# Patient Record
Sex: Male | Born: 1970 | Race: White | Hispanic: No | Marital: Single | Smoking: Never smoker
Health system: Southern US, Community
[De-identification: ages and names within clinical notes are randomized; demographics above are authoritative.]

## PROBLEM LIST (undated history)

## (undated) DIAGNOSIS — N400 Enlarged prostate without lower urinary tract symptoms: Secondary | ICD-10-CM

## (undated) DIAGNOSIS — R569 Unspecified convulsions: Secondary | ICD-10-CM

## (undated) DIAGNOSIS — F419 Anxiety disorder, unspecified: Secondary | ICD-10-CM

## (undated) DIAGNOSIS — F840 Autistic disorder: Secondary | ICD-10-CM

## (undated) DIAGNOSIS — F29 Unspecified psychosis not due to a substance or known physiological condition: Secondary | ICD-10-CM

## (undated) DIAGNOSIS — E785 Hyperlipidemia, unspecified: Secondary | ICD-10-CM

## (undated) HISTORY — DX: Hyperlipidemia, unspecified: E78.5

## (undated) HISTORY — DX: Benign prostatic hyperplasia without lower urinary tract symptoms: N40.0

## (undated) HISTORY — DX: Anxiety disorder, unspecified: F41.9

## (undated) HISTORY — DX: Unspecified psychosis not due to a substance or known physiological condition: F29

## (undated) HISTORY — DX: Unspecified convulsions: R56.9

## (undated) HISTORY — DX: Autistic disorder: F84.0

---

## 2005-01-03 ENCOUNTER — Other Ambulatory Visit: Payer: Self-pay

## 2005-01-03 ENCOUNTER — Emergency Department: Payer: Self-pay | Admitting: Emergency Medicine

## 2005-03-03 ENCOUNTER — Emergency Department: Payer: Self-pay | Admitting: Emergency Medicine

## 2008-05-24 ENCOUNTER — Emergency Department: Payer: Self-pay | Admitting: Emergency Medicine

## 2012-11-05 ENCOUNTER — Telehealth: Payer: Self-pay | Admitting: Nurse Practitioner

## 2012-11-07 NOTE — Telephone Encounter (Signed)
I attempted to return Michelle's call at [phone]. I left a message for Marcelino Duster to call [facility] about Molly Maduro. I have scheduled him for an appointment. Please see schedule.

## 2012-11-07 NOTE — Telephone Encounter (Signed)
He does not have appt scheduled. See first available

## 2012-11-07 NOTE — Telephone Encounter (Signed)
I called and LMVM for Marcelino Duster that I was returning call.

## 2012-11-11 ENCOUNTER — Encounter: Payer: Self-pay | Admitting: *Deleted

## 2012-11-14 ENCOUNTER — Encounter: Payer: Self-pay | Admitting: Neurology

## 2012-11-14 ENCOUNTER — Other Ambulatory Visit: Payer: Self-pay

## 2012-11-14 ENCOUNTER — Ambulatory Visit (INDEPENDENT_AMBULATORY_CARE_PROVIDER_SITE_OTHER): Payer: Medicare Other | Admitting: Neurology

## 2012-11-14 VITALS — BP 99/62 | HR 79 | Ht 67.0 in | Wt 137.0 lb

## 2012-11-14 DIAGNOSIS — G40309 Generalized idiopathic epilepsy and epileptic syndromes, not intractable, without status epilepticus: Secondary | ICD-10-CM | POA: Insufficient documentation

## 2012-11-14 DIAGNOSIS — Z5181 Encounter for therapeutic drug level monitoring: Secondary | ICD-10-CM

## 2012-11-14 MED ORDER — VITAMIN D 1000 UNITS PO TABS: 1000.0000 [IU] | ORAL_TABLET | Freq: Every day | ORAL | Status: DC

## 2012-11-14 NOTE — Progress Notes (Signed)
   Reason for visit: Seizures  [name] is an 42 y.o. male  History of present illness:  [name] is a 42 year old right-handed white male with a history of autism and seizures. The patient had a seizure sometime within the last month. According to the caretaker, the patient missed several doses of Dilantin around the time of the seizures. When he was seen in October 2013, the patient was on 200 mg of Dilantin twice daily. The blood level revealed a Dilantin level of 23, but the patient was tolerating this dose well, and the Dilantin dosing was not changed. At some point, the Dilantin dosing was reduced to 300 mg daily. The patient returns for an evaluation.  Past Medical History  Diagnosis Date  . Seizures   . Autism   . Psychosis   . Dyslipidemia   . Anxiety disorder     History reviewed. No pertinent past surgical history.  History reviewed. No pertinent family history.  Social history:  reports that he has never smoked. He has never used smokeless tobacco. He reports that he does not drink alcohol or use illicit drugs.   No Known Allergies  Medications:  Current Outpatient Prescriptions on File Prior to Visit  Medication Sig Dispense Refill  . busPIRone (BUSPAR) 15 MG tablet Take 15 mg by mouth 2 (two) times daily.      . fenofibrate (TRICOR) 145 MG tablet Take 145 mg by mouth daily.      Marland Kitchen LORazepam (ATIVAN) 1 MG tablet Take 1 mg by mouth daily.      . phenytoin (DILANTIN) 100 MG ER capsule Take 300 mg by mouth at bedtime. Using 100mg  caps  ( 2 capsules po bid)       No current facility-administered medications on file prior to visit.    ROS:  Out of a complete 14 system review of symptoms, the patient complains only of the following symptoms, and all other reviewed systems are negative.  History of seizures  Blood pressure 99/62, pulse 79, height 5\' 7"  (1.702 m), weight 137 lb (62.143 kg).  Physical Exam  General: The patient is alert and cooperative at  the time of the examination.  Skin: No significant peripheral edema is noted.   Neurologic Exam  Cranial nerves: Facial symmetry is present. Speech is normal, no aphasia or dysarthria is noted. Extraocular movements are full. Visual fields are full.  Motor: The patient has good strength in all 4 extremities.  Coordination: The patient has good finger-nose-finger and heel-to-shin bilaterally, but he is apraxic with the use of the lower extremities..  Gait and station: The patient has a normal gait. Tandem gait is apraxic. Romberg is negative. No drift is seen.  Reflexes: Deep tendon reflexes are symmetric.   Assessment/Plan:  One. History seizures  2. Autism  The patient is on 300 mg of Dilantin, brand-name, at this time. We will check a Dilantin level at this point. The patient will followup in 6 months. The patient will go on vitamin D, 1000 units daily.  Clarence Palau MD 11/15/2012 3:34 PM  [facility] [address] [place], [place] [address]  Phone [phone] Fax [phone]

## 2012-11-14 NOTE — Telephone Encounter (Signed)
The patient requested we sent Rx for Vitamin D to the pharmacy.

## 2012-11-17 ENCOUNTER — Telehealth: Payer: Self-pay | Admitting: Neurology

## 2012-11-17 MED ORDER — PHENYTOIN 50 MG PO CHEW: 50.0000 mg | CHEWABLE_TABLET | Freq: Every day | ORAL | Status: DC

## 2012-11-17 NOTE — Telephone Encounter (Signed)
I called the patient and I talked with a caretaker. The Dilantin level was slightly low at 9.6. I'll increase the dosing of the Dilantin taking 350 mg total daily. I will call in a 50 mg tablet.

## 2012-11-18 ENCOUNTER — Ambulatory Visit: Payer: Self-pay | Admitting: Nurse Practitioner

## 2013-01-06 ENCOUNTER — Ambulatory Visit: Payer: Self-pay | Admitting: Nurse Practitioner

## 2013-03-13 ENCOUNTER — Emergency Department: Payer: Self-pay | Admitting: Emergency Medicine

## 2013-03-13 LAB — COMPREHENSIVE METABOLIC PANEL
ALBUMIN: 4 g/dL (ref 3.4–5.0)
ALK PHOS: 68 U/L
ALT: 27 U/L (ref 12–78)
AST: 35 U/L (ref 15–37)
Anion Gap: 1 — ABNORMAL LOW (ref 7–16)
BUN: 14 mg/dL (ref 7–18)
Bilirubin,Total: 0.2 mg/dL (ref 0.2–1.0)
CALCIUM: 9.1 mg/dL (ref 8.5–10.1)
CREATININE: 1.15 mg/dL (ref 0.60–1.30)
Chloride: 105 mmol/L (ref 98–107)
Co2: 31 mmol/L (ref 21–32)
EGFR (Non-African Amer.): >60
Glucose: 105 mg/dL — ABNORMAL HIGH (ref 65–99)
Osmolality: 275 (ref 275–301)
Potassium: 3.7 mmol/L (ref 3.5–5.1)
Sodium: 137 mmol/L (ref 136–145)
Total Protein: 7.3 g/dL (ref 6.4–8.2)

## 2013-03-13 LAB — CBC
HCT: 36.6 % — AB (ref 40.0–52.0)
HGB: 12.7 g/dL — ABNORMAL LOW (ref 13.0–18.0)
MCH: 34 pg (ref 26.0–34.0)
MCHC: 34.6 g/dL (ref 32.0–36.0)
MCV: 98 fL (ref 80–100)
Platelet: 239 10*3/uL (ref 150–440)
RBC: 3.73 10*6/uL — AB (ref 4.40–5.90)
RDW: 12 % (ref 11.5–14.5)
WBC: 5.3 10*3/uL (ref 3.8–10.6)

## 2013-03-13 LAB — SALICYLATE LEVEL

## 2013-03-13 LAB — ACETAMINOPHEN LEVEL

## 2013-03-13 LAB — ETHANOL
Ethanol %: <0.003 % (ref 0.000–0.080)
Ethanol: <3 mg/dL

## 2013-03-15 LAB — URINALYSIS, COMPLETE
Bacteria: NONE SEEN
Bilirubin,UR: NEGATIVE
Blood: NEGATIVE
GLUCOSE, UR: NEGATIVE mg/dL (ref 0–75)
Ketone: NEGATIVE
LEUKOCYTE ESTERASE: NEGATIVE
NITRITE: NEGATIVE
PROTEIN: NEGATIVE
Ph: 5 (ref 4.5–8.0)
RBC,UR: 1 /HPF (ref 0–5)
Specific Gravity: 1.018 (ref 1.003–1.030)
Squamous Epithelial: NONE SEEN
WBC UR: 1 /HPF (ref 0–5)

## 2013-03-15 LAB — DRUG SCREEN, URINE

## 2013-05-13 ENCOUNTER — Telehealth: Payer: Self-pay | Admitting: Nurse Practitioner

## 2013-05-14 ENCOUNTER — Encounter: Payer: Self-pay | Admitting: Nurse Practitioner

## 2013-05-14 ENCOUNTER — Encounter (INDEPENDENT_AMBULATORY_CARE_PROVIDER_SITE_OTHER): Payer: Self-pay

## 2013-05-14 ENCOUNTER — Other Ambulatory Visit: Payer: Self-pay | Admitting: Nurse Practitioner

## 2013-05-14 ENCOUNTER — Ambulatory Visit (INDEPENDENT_AMBULATORY_CARE_PROVIDER_SITE_OTHER): Payer: Medicare Other | Admitting: Nurse Practitioner

## 2013-05-14 VITALS — BP 132/79 | HR 93 | Ht 66.5 in | Wt 148.0 lb

## 2013-05-14 DIAGNOSIS — G40309 Generalized idiopathic epilepsy and epileptic syndromes, not intractable, without status epilepticus: Secondary | ICD-10-CM

## 2013-05-14 DIAGNOSIS — Z79899 Other long term (current) drug therapy: Secondary | ICD-10-CM

## 2013-05-14 NOTE — Patient Instructions (Signed)
Per group home sheet 

## 2013-05-14 NOTE — Progress Notes (Signed)
[facility]  PATIENT: [name] DOB: 10/04/1970   REASON FOR VISIT: Followup for seizure disorder   HISTORY OF PRESENT ILLNESS:[name], 43 -year-old male returns for followup. He was last seen in this office on 11/14/2012 by Dr. Jannifer Franklin. He had an adjustment to his Dilantin at that time from 300 mg to 350 mg. He currently takes his Dilantin in the mornings according to the caregiver from his group home. He has not had any further seizure activity since September 2014. He had admission to behavioral health in January for behavior management. He returns for reevaluation  HISTORY: of autism and seizures. The patient had a seizure sometime within the last month. According to the caretaker, the patient missed several doses of Dilantin around the time of the seizures. When he was seen in October 2013, the patient was on 200 mg of Dilantin twice daily. The blood level revealed a Dilantin level of 23, but the patient was tolerating this dose well, and the Dilantin dosing was not changed. At some point, the Dilantin dosing was reduced to 300 mg daily. The patient returns for an evaluation.   REVIEW OF SYSTEMS: Full 14 system review of systems performed and notable only for those listed, all others are neg:  Constitutional: N/A  Cardiovascular: N/A  Ear/Nose/Throat: N/A  Skin: N/A  Eyes: N/A  Respiratory: N/A  Gastroitestinal: N/A  Hematology/Lymphatic: N/A  Endocrine: N/A Musculoskeletal:N/A  Allergy/Immunology: N/A  Neurological: N/A Psychiatric: agitation, depression ,anxiety, hyperactivity  ALLERGIES: No Known Allergies  HOME MEDICATIONS: Outpatient Prescriptions Prior to Visit  Medication Sig Dispense Refill  . cholecalciferol (VITAMIN D) 1000 UNITS tablet Take 1 tablet (1,000 Units total) by mouth daily.  90 tablet  1  . cloNIDine (CATAPRES - DOSED IN MG/24 HR) 0.2 mg/24hr patch Place 1 patch onto the skin once a week.      Marland Kitchen dextromethorphan (DELSYM)  30 MG/5ML liquid Take 30 mg by mouth as needed for cough.      . fenofibrate (TRICOR) 145 MG tablet Take 145 mg by mouth daily.      . hydrocortisone (ANUSOL-HC) 2.5 % rectal cream Place 2.5 application rectally as needed.       . hydrocortisone 2.5 % cream Apply 2.5 application topically as needed.       . Hydrocortisone Butyrate 0.1 % SOLN 0.1 Bottles as needed (monday- friday night).       Marland Kitchen ketoconazole (NIZORAL) 2 % shampoo Apply 2 application topically daily.      Marland Kitchen LORazepam (ATIVAN) 1 MG tablet Take 1 mg by mouth daily.      . phenytoin (DILANTIN) 100 MG ER capsule Take 300 mg by mouth at bedtime. Using 100mg  caps  ( 2 capsules po bid)      . phenytoin (DILANTIN) 50 MG tablet Chew 1 tablet (50 mg total) by mouth daily.  90 tablet  3  . polyethylene glycol powder (GLYCOLAX/MIRALAX) powder Take 17 g by mouth as needed (Tuesday and Thursday).       . busPIRone (BUSPAR) 15 MG tablet Take 15 mg by mouth 2 (two) times daily.       No facility-administered medications prior to visit.    PAST MEDICAL HISTORY: Past Medical History  Diagnosis Date  . Seizures   . Autism   . Psychosis   . Dyslipidemia   . Anxiety disorder     PAST SURGICAL HISTORY: History reviewed. No pertinent past surgical history.  FAMILY HISTORY: History reviewed. No pertinent family history.  SOCIAL HISTORY: History   Social History  . Marital Status: Unknown    Spouse Name: N/A    Number of Children: 0  . Years of Education: N/A   Occupational History  . Not on file.   Social History Main Topics  . Smoking status: Never Smoker   . Smokeless tobacco: Never Used  . Alcohol Use: No  . Drug Use: No  . Sexual Activity: Not on file   Other Topics Concern  . Not on file   Social History Narrative  . No narrative on file     PHYSICAL EXAM  Filed Vitals:   05/14/13 1054  BP: 132/79  Pulse: 93  Height: 5' 6.5" (1.689 m)  Weight: 148 lb (67.132 kg)   Body mass index is 23.53  kg/(m^2).  Generalized: Well developed, in no acute distress    Neurological examination   Mentation: Alert oriented to time, place, history taking. Follows all commands speech and language fluent  Cranial nerve II-XII: .Pupils were equal round reactive to light extraocular movements were full, visual field were full on confrontational test. Facial sensation and strength were normal. hearing was intact to finger rubbing bilaterally. Uvula tongue midline. head turning and shoulder shrug were normal and symmetric.Tongue protrusion into cheek strength was normal. Motor: normal bulk and tone, full strength in the BUE, BLE, . No focal weakness Coordination: finger-nose-finger, heel-to-shin bilaterally, apraxia with the use of lower extremities  Reflexes: Brachioradialis 2/2, biceps 2/2, triceps 2/2, patellar 2/2, Achilles 2/2, plantar responses were flexor bilaterally. Gait and Station: Rising up from seated position without assistance, normal stance,  moderate stride, good arm swing, smooth turning Tandem gait is apraxic   DIAGNOSTIC DATA (LABS, IMAGING, TESTING) -   ASSESSMENT AND PLAN  43 y.o. year old male  has a past medical history of Seizures; Autism; Psychosis; Dyslipidemia; and Anxiety disorder. here to followup for seizure disorder.  Will obtain trough level of Dilantin in the a.m., patient already has taken his meds Followup in 6 months [name], [facility], [facility], [facility] [address], [place], [place] [address] [phone]

## 2013-05-14 NOTE — Progress Notes (Signed)
I have read the note, and I agree with the clinical assessment and plan.  [name],[name]   

## 2013-06-01 ENCOUNTER — Other Ambulatory Visit: Payer: Self-pay | Admitting: Nurse Practitioner

## 2013-08-07 ENCOUNTER — Encounter: Payer: Self-pay | Admitting: Neurology

## 2013-08-07 ENCOUNTER — Telehealth: Payer: Self-pay | Admitting: Nurse Practitioner

## 2013-08-07 DIAGNOSIS — J309 Allergic rhinitis, unspecified: Secondary | ICD-10-CM | POA: Insufficient documentation

## 2013-08-07 DIAGNOSIS — F32A Depression, unspecified: Secondary | ICD-10-CM | POA: Insufficient documentation

## 2013-08-07 DIAGNOSIS — F329 Major depressive disorder, single episode, unspecified: Secondary | ICD-10-CM | POA: Insufficient documentation

## 2013-08-07 DIAGNOSIS — G43909 Migraine, unspecified, not intractable, without status migrainosus: Secondary | ICD-10-CM | POA: Insufficient documentation

## 2013-08-07 DIAGNOSIS — E781 Pure hyperglyceridemia: Secondary | ICD-10-CM | POA: Insufficient documentation

## 2013-08-07 NOTE — Telephone Encounter (Signed)
Called patient regarding rescheduling 11/18/13 appointment per Carolyn's schedule, left message with his worker regarding new appointment time. Printed and sent letter.

## 2013-09-10 LAB — PHENYTOIN LEVEL, FREE
PHENYTOIN: 8.9 ug/mL — AB (ref 10.0–20.0)
Phenytoin, Free: 0.6 ug/mL — ABNORMAL LOW (ref 1.0–2.0)

## 2013-09-16 ENCOUNTER — Encounter: Payer: Self-pay | Admitting: Neurology

## 2013-09-21 NOTE — Progress Notes (Signed)
Unable to reach patient by phone, letter has been sent with lab results.

## 2013-11-18 ENCOUNTER — Ambulatory Visit: Payer: Medicare Other | Admitting: Nurse Practitioner

## 2013-11-24 ENCOUNTER — Encounter (INDEPENDENT_AMBULATORY_CARE_PROVIDER_SITE_OTHER): Payer: Self-pay

## 2013-11-24 ENCOUNTER — Ambulatory Visit (INDEPENDENT_AMBULATORY_CARE_PROVIDER_SITE_OTHER): Payer: Medicare Other | Admitting: Nurse Practitioner

## 2013-11-24 ENCOUNTER — Other Ambulatory Visit: Payer: Self-pay | Admitting: *Deleted

## 2013-11-24 ENCOUNTER — Encounter: Payer: Self-pay | Admitting: Nurse Practitioner

## 2013-11-24 VITALS — BP 116/72 | HR 89 | Ht 66.5 in | Wt 160.4 lb

## 2013-11-24 DIAGNOSIS — Z5181 Encounter for therapeutic drug level monitoring: Secondary | ICD-10-CM

## 2013-11-24 DIAGNOSIS — G40309 Generalized idiopathic epilepsy and epileptic syndromes, not intractable, without status epilepticus: Secondary | ICD-10-CM

## 2013-11-24 MED ORDER — DIAZEPAM 10 MG PO TABS: 10.0000 mg | ORAL_TABLET | Freq: Once | ORAL | Status: DC

## 2013-11-24 MED ORDER — PHENYTOIN SODIUM EXTENDED 100 MG PO CAPS: 300.0000 mg | ORAL_CAPSULE | Freq: Every day | ORAL | Status: DC

## 2013-11-24 MED ORDER — PHENYTOIN 50 MG PO CHEW: 50.0000 mg | CHEWABLE_TABLET | Freq: Every day | ORAL | Status: DC

## 2013-11-24 NOTE — Progress Notes (Signed)
[facility]  PATIENT: [name] DOB: 27-Oct-1970   REASON FOR VISIT: Followup for seizure disorder   HISTORY OF PRESENT ILLNESS:[name], 43 -year-old male returns for followup. He was last seen in this office on 05/14/13.Dilantin dose is 350 mg. He currently takes his Dilantin in the mornings according to the caregiver from his group home. He has not had any further seizure activity since September 2014. He had admission to behavioral health in January 2015  for behavior management. Appetite is good, sleeping well. Lives in a group home. He returns for reevaluation.   HISTORY: of autism and seizures. The patient had a seizure sometime within the last month. According to the caretaker, the patient missed several doses of Dilantin around the time of the seizures. When he was seen in October 2013, the patient was on 200 mg of Dilantin twice daily. The blood level revealed a Dilantin level of 23, but the patient was tolerating this dose well, and the Dilantin dosing was not changed. At some point, the Dilantin dosing was reduced to 300 mg daily. The patient returns for an evaluation.    REVIEW OF SYSTEMS: Full 14 system review of systems performed and notable only for those listed, all others are neg:  Constitutional: N/A  Cardiovascular: N/A  Ear/Nose/Throat: N/A  Skin: N/A  Eyes: N/A  Respiratory: N/A  Gastroitestinal: N/A  Hematology/Lymphatic: N/A  Endocrine: N/A Musculoskeletal:N/A  Allergy/Immunology: N/A  Neurological: N/A Psychiatric: N/A Sleep : NA   ALLERGIES: No Known Allergies  HOME MEDICATIONS: Outpatient Prescriptions Prior to Visit  Medication Sig Dispense Refill  . chlorproMAZINE (THORAZINE) 50 MG tablet       . cholecalciferol (VITAMIN D) 1000 UNITS tablet Take 1 tablet (1,000 Units total) by mouth daily.  90 tablet  1  . cloNIDine (CATAPRES - DOSED IN MG/24 HR) 0.2 mg/24hr patch Place 1 patch onto the skin once a week.      Marland Kitchen  dextromethorphan (DELSYM) 30 MG/5ML liquid Take 30 mg by mouth as needed for cough.      . diazepam (VALIUM) 10 MG tablet       . fenofibrate (TRICOR) 145 MG tablet Take 145 mg by mouth daily.      . hydrocortisone (ANUSOL-HC) 2.5 % rectal cream Place 2.5 application rectally as needed.       . hydrocortisone 2.5 % cream Apply 2.5 application topically as needed.       . Hydrocortisone Butyrate 0.1 % SOLN 0.1 Bottles as needed (monday- friday night).       Marland Kitchen ketoconazole (NIZORAL) 2 % shampoo Apply 2 application topically daily.      Marland Kitchen LORazepam (ATIVAN) 1 MG tablet Take 1 mg by mouth daily.      . phenytoin (DILANTIN) 100 MG ER capsule Take 300 mg by mouth at bedtime. Using 100mg  caps  ( 2 capsules po bid)      . phenytoin (DILANTIN) 50 MG tablet Chew 1 tablet (50 mg total) by mouth daily.  90 tablet  3  . polyethylene glycol powder (GLYCOLAX/MIRALAX) powder Take 17 g by mouth as needed (Tuesday and Thursday).       . risperiDONE (RISPERDAL) 0.5 MG tablet       . SUMAtriptan Succinate (IMITREX PO) Take 25 mg by mouth as needed.       No facility-administered medications prior to visit.    PAST MEDICAL HISTORY: Past Medical History  Diagnosis Date  . Seizures   . Autism   .  Psychosis   . Dyslipidemia   . Anxiety disorder     PAST SURGICAL HISTORY: History reviewed. No pertinent past surgical history.  FAMILY HISTORY: History reviewed. No pertinent family history.  SOCIAL HISTORY: History   Social History  . Marital Status: Unknown    Spouse Name: N/A    Number of Children: 0  . Years of Education: N/A   Occupational History  . Not on file.   Social History Main Topics  . Smoking status: Never Smoker   . Smokeless tobacco: Never Used  . Alcohol Use: No  . Drug Use: No  . Sexual Activity: Not on file   Other Topics Concern  . Not on file   Social History Narrative  . No narrative on file     PHYSICAL EXAM  Filed Vitals:   11/24/13 1522  BP: 116/72    Pulse: 89  Height: 5' 6.5" (1.689 m)  Weight: 160 lb 6.4 oz (72.757 kg)   Body mass index is 25.5 kg/(m^2). Generalized: Well developed, in no acute distress  Neurological examination  Mentation: Alert oriented to time, place, history taking. Follows most  commands speech and language fluent  Cranial nerve II-XII: .Pupils were equal round reactive to light extraocular movements were full, visual field were full on confrontational test. Facial sensation and strength were normal. hearing was intact to finger rubbing bilaterally. Uvula tongue midline. head turning and shoulder shrug were normal and symmetric.Tongue protrusion into cheek strength was normal.  Motor: normal bulk and tone, full strength in the BUE, BLE, . No focal weakness  Coordination: finger-nose-finger, heel-to-shin bilaterally, apraxia with the use of lower extremities  Reflexes: Brachioradialis 2/2, biceps 2/2, triceps 2/2, patellar 2/2, Achilles 2/2, plantar responses were flexor bilaterally.  Gait and Station: Rising up from seated position without assistance, normal stance, moderate stride, good arm swing, smooth turning Tandem gait is apraxic   ASSESSMENT AND PLAN  43 y.o. year old male  has a past medical history of Seizures; Autism; Psychosis; Dyslipidemia; and Anxiety disorder. here to follow up for seizure disorder. Last seizure September 2014  Check CBC and CMP,  Patient needs Valium prior to having the labs. Call for any seizure activity Will renew meds Follow up yearly and  when necessary Dennie Bible, [facility], [facility], APRN  [name] [address], [place], [place] [address] [phone]

## 2013-11-24 NOTE — Patient Instructions (Signed)
Per group home sheet 

## 2013-11-25 ENCOUNTER — Other Ambulatory Visit: Payer: Self-pay

## 2013-11-25 MED ORDER — PHENYTOIN SODIUM EXTENDED 100 MG PO CAPS: 300.0000 mg | ORAL_CAPSULE | Freq: Every day | ORAL | Status: DC

## 2013-11-25 MED ORDER — PHENYTOIN 50 MG PO CHEW: 50.0000 mg | CHEWABLE_TABLET | Freq: Every day | ORAL | Status: DC

## 2013-11-25 NOTE — Telephone Encounter (Signed)
Pharmacy requests written Rx, will not accept E-Rx.

## 2013-11-25 NOTE — Progress Notes (Signed)
I have read the note, and I agree with the clinical assessment and plan.  Vignesh Willert KEITH   

## 2013-12-22 ENCOUNTER — Telehealth: Payer: Self-pay | Admitting: Nurse Practitioner

## 2013-12-22 NOTE — Telephone Encounter (Signed)
Lattie Haw Younger calling from [employer] in [place] to get patient's ICD-10 code, please return call and advise.

## 2013-12-22 NOTE — Telephone Encounter (Signed)
Office note with ICD_10 codes faxed to [place]: Dolan Amen.

## 2014-02-01 ENCOUNTER — Telehealth: Payer: Self-pay | Admitting: Nurse Practitioner

## 2014-02-01 NOTE — Telephone Encounter (Signed)
I have not received the results from labs ordered on his visit in September. He lives in a group home. Please call to see if labs were done.

## 2014-02-11 NOTE — Telephone Encounter (Signed)
I called [phone] and spoke with Jocelyn Lamer, the patients caseworker.  Jocelyn Lamer stated that the patient has not gotten his labs done yet.  The patient has a CPE appt on 03/22/14 and will get all his labs done then and she will make sure they check his Dilantin Level and forward those results to [place].

## 2014-07-03 NOTE — Consult Note (Signed)
PATIENT NAME:  [name], [name] MR#:  [id number] DATE OF BIRTH:  1970-12-22  DATE OF CONSULTATION:  03/16/2013  REFERRING PHYSICIAN:   CONSULTING PHYSICIAN:  Majestic Brister K. Velton Roselle, MD  SUBJECTIVE:  The patient was seen in the Emergency Room at [facility].  The patient is a 44 year old white male with a long history of mental illness and schizoaffective disorder and mild MR.  The patient has been living at a group home where he became aggressive and upset, and was brought here.  Mr. Sharen Hones called the group home and found out more details about this.  Staff at the Emergency Room report that he has been calm and cooperative over the weekend and with no aggressive behavior.    MENTAL STATUS:  Alert but continues to stay confused, but he knew where he is and his name with a little prompting and help.  He perseverates a lot but that is his baseline functioning.  He did not know the exact date.  Much calmer today than he was when he came in, cooperative and pleasant, and compliant in taking medications.  Does not appear to be actively responding to internal stimuli.  Cognition is below normal because of mild MR.  Denies any ideas or plans to hurt himself or others.  Contracts for safety and is eager to go back to the group home.  Insight and judgment are guarded as before.    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  There is no aggressive behavior.    PLAN AND RECOMMENDATION:  Discontinue IVC as the patient is safe and contracts for safety.  Will go back to live at the group home and will be followed by the physicians at the group home.    ____________________________ Wallace Cullens. Franchot Mimes, MD skc:cs D: 03/16/2013 14:12:40 ET T: 03/16/2013 15:17:11 ET JOB#: [id number]  cc: Arlyn Leak K. Franchot Mimes, MD, <Dictator> Dewain Penning MD ELECTRONICALLY SIGNED 03/23/2013 7:58

## 2014-11-25 ENCOUNTER — Ambulatory Visit: Payer: Medicare Other | Admitting: Nurse Practitioner

## 2014-11-30 ENCOUNTER — Ambulatory Visit (INDEPENDENT_AMBULATORY_CARE_PROVIDER_SITE_OTHER): Payer: Medicare Other | Admitting: Nurse Practitioner

## 2014-11-30 ENCOUNTER — Encounter: Payer: Self-pay | Admitting: Nurse Practitioner

## 2014-11-30 ENCOUNTER — Telehealth: Payer: Self-pay | Admitting: Nurse Practitioner

## 2014-11-30 VITALS — BP 126/80 | HR 88 | Ht 67.0 in | Wt 165.6 lb

## 2014-11-30 DIAGNOSIS — G40309 Generalized idiopathic epilepsy and epileptic syndromes, not intractable, without status epilepticus: Secondary | ICD-10-CM

## 2014-11-30 DIAGNOSIS — F419 Anxiety disorder, unspecified: Secondary | ICD-10-CM | POA: Insufficient documentation

## 2014-11-30 MED ORDER — PHENYTOIN 50 MG PO CHEW: 50.0000 mg | CHEWABLE_TABLET | Freq: Every day | ORAL | Status: DC

## 2014-11-30 MED ORDER — PHENYTOIN SODIUM EXTENDED 100 MG PO CAPS: 300.0000 mg | ORAL_CAPSULE | Freq: Every day | ORAL | Status: DC

## 2014-11-30 NOTE — Progress Notes (Signed)
[facility]  PATIENT: JAYSUN WESSELS DOB: 05-02-1970   REASON FOR VISIT: Follow-up for generalized epilepsy  HISTORY FROM: Patient    HISTORY OF PRESENT ILLNESS:Mr. Kady, 44 year old male returns for followup. He was last seen in this office on 11/24/13.He has a history of generalized epilepsy and is currently taking Dilantin  350 mg daily. He currently takes his Dilantin in the mornings according to the caregiver from his group home. He has not had any further seizure activity since September 2014. He also has autism. He had admission to behavioral health in January 2015 for behavior management. Appetite is good, sleeping well. Lives in a group home. He goes to a day program Monday through Friday. Recent labs 11/16/14 from primary care ,  CBC CMP within normal limits,  last Dilantin  level was therapeutic. He requires sedation for labs. He returns for reevaluation.   HISTORY: of autism and seizures. The patient had a seizure sometime within the last month. According to the caretaker, the patient missed several doses of Dilantin around the time of the seizures. When he was seen in October 2013, the patient was on 200 mg of Dilantin twice daily. The blood level revealed a Dilantin level of 23, but the patient was tolerating this dose well, and the Dilantin dosing was not changed. At some point, the Dilantin dosing was reduced to 300 mg daily. The patient returns for an evaluation.     REVIEW OF SYSTEMS: Full 14 system review of systems performed and notable only for those listed, all others are neg:  Constitutional: neg  Cardiovascular: neg Ear/Nose/Throat: neg  Skin: neg Eyes: neg Respiratory: neg Gastroitestinal: neg  Hematology/Lymphatic: neg  Endocrine: neg Musculoskeletal:neg Allergy/Immunology: neg Neurological: History of seizure disorder, autism, migraines Psychiatric: Depression, behavior problems Sleep : neg   ALLERGIES: No Known  Allergies  HOME MEDICATIONS: Outpatient Prescriptions Prior to Visit  Medication Sig Dispense Refill  . cholecalciferol (VITAMIN D) 1000 UNITS tablet Take 1 tablet (1,000 Units total) by mouth daily. 90 tablet 1  . cloNIDine (CATAPRES - DOSED IN MG/24 HR) 0.2 mg/24hr patch Place 1 patch onto the skin once a week.    Marland Kitchen dextromethorphan (DELSYM) 30 MG/5ML liquid Take 30 mg by mouth as needed for cough.    . diazepam (VALIUM) 10 MG tablet Take 1 tablet (10 mg total) by mouth once. Prior to lab draw 1 tablet 0  . hydrocortisone (ANUSOL-HC) 2.5 % rectal cream Place 2.5 application rectally as needed.     . hydrocortisone 2.5 % cream Apply 2.5 application topically as needed.     Marland Kitchen ketoconazole (NIZORAL) 2 % shampoo Apply 2 application topically daily.    Marland Kitchen LORazepam (ATIVAN) 1 MG tablet Take 1 mg by mouth daily.    . phenytoin (DILANTIN) 100 MG ER capsule Take 3 capsules (300 mg total) by mouth at bedtime. 270 capsule 3  . phenytoin (DILANTIN) 50 MG tablet Chew 1 tablet (50 mg total) by mouth daily. 90 tablet 3  . polyethylene glycol powder (GLYCOLAX/MIRALAX) powder Take 17 g by mouth as needed (Tuesday and Thursday).     . risperiDONE (RISPERDAL) 0.5 MG tablet     . fexofenadine (ALLEGRA) 180 MG tablet Take by mouth.    . chlorproMAZINE (THORAZINE) 50 MG tablet     . fenofibrate (TRICOR) 145 MG tablet Take 145 mg by mouth daily.    . Hydrocortisone Butyrate 0.1 % SOLN 0.1 Bottles as needed (monday- friday night).     Marland Kitchen  SUMAtriptan Succinate (IMITREX PO) Take 25 mg by mouth as needed.     No facility-administered medications prior to visit.    PAST MEDICAL HISTORY: Past Medical History  Diagnosis Date  . Seizures   . Autism   . Psychosis   . Dyslipidemia   . Anxiety disorder     PAST SURGICAL HISTORY: History reviewed. No pertinent past surgical history.  FAMILY HISTORY: History reviewed. No pertinent family history.  SOCIAL HISTORY: Social History   Social History  . Marital  Status: Unknown    Spouse Name: N/A  . Number of Children: 0  . Years of Education: N/A   Occupational History  . Not on file.   Social History Main Topics  . Smoking status: Never Smoker   . Smokeless tobacco: Never Used  . Alcohol Use: No  . Drug Use: No  . Sexual Activity: Not on file   Other Topics Concern  . Not on file   Social History Narrative     PHYSICAL EXAM  Filed Vitals:   11/30/14 0919  BP: 126/80  Pulse: 88  Height: 5\' 7"  (1.702 m)  Weight: 165 lb 9.6 oz (75.116 kg)   Body mass index is 25.93 kg/(m^2). Generalized: Well developed, in no acute distress  Neurological examination  Mentation: Alert oriented to time, place, history taking. Follows most commands speech and language fluent  Cranial nerve II-XII: .Pupils were equal round reactive to light extraocular movements were full, visual field were full on confrontational test. Facial sensation and strength were normal. hearing was intact to finger rubbing bilaterally. Uvula tongue midline. head turning and shoulder shrug were normal and symmetric.Tongue protrusion into cheek strength was normal.  Motor: normal bulk and tone, full strength in the BUE, BLE, . No focal weakness  Coordination: finger-nose-finger, heel-to-shin bilaterally, apraxia with the use of lower extremities  Reflexes: Brachioradialis 2/2, biceps 2/2, triceps 2/2, patellar 2/2, Achilles 2/2, plantar responses were flexor bilaterally.  Gait and Station: Rising up from seated position without assistance, normal stance, moderate stride, good arm swing, smooth turning Tandem gait is apraxic   DIAGNOSTIC DATA (LABS, IMAGING, TESTING) -  ASSESSMENT AND PLAN  44 y.o. year old male  has a past medical history of generalized seizures; Autism; Psychosis; Dyslipidemia; and Anxiety disorder. here to follow-up. Recent labs 11/16/14 from primary care ,  CBC CMP within normal limits,  last Dilantin  level was therapeutic. He requires sedation for  labs.  Continue Dilantin at current dose will renew for one year Call for any seizure activity Follow-up yearly and when necessary Dennie Bible, [facility], [facility], [facility] [address] [place], [place] [address] [phone]

## 2014-11-30 NOTE — Patient Instructions (Signed)
Per group home sheet 

## 2014-11-30 NOTE — Progress Notes (Signed)
I have read the note, and I agree with the clinical assessment and plan.  [name],CHARLES KEITH   

## 2014-11-30 NOTE — Telephone Encounter (Signed)
Heather/[facility], [place] [phone] called regarding phenytoin (DILANTIN) 100 MG ER capsule previous Rx says to take at 7am, Rx now says to take at bedtime. Heather needs clarification.

## 2014-11-30 NOTE — Telephone Encounter (Signed)
I called and spoke to coworker of [employer].  After looking at the previous prescription she noted that it was the same.  Dilantin 300mg  po at bedtime and dilantin 50mg  po daily.  Disregard note from below.

## 2014-12-27 ENCOUNTER — Telehealth: Payer: Self-pay | Admitting: Nurse Practitioner

## 2014-12-27 NOTE — Telephone Encounter (Signed)
[facility] [phone] called to request Rx be written for phenytoin (DILANTIN) 100 MG ER capsule to be taken in the morning vs taken at night. This is how patient has been taking this medication.

## 2014-12-27 NOTE — Telephone Encounter (Signed)
LMVM for [name] with [facility] that I returned call.  Wanted to confirm pts dose.

## 2014-12-28 MED ORDER — PHENYTOIN SODIUM EXTENDED 100 MG PO CAPS
300.0000 mg | ORAL_CAPSULE | Freq: Every day | ORAL | Status: DC
Start: 2014-12-28 — End: 2015-01-12

## 2014-12-28 NOTE — Telephone Encounter (Signed)
Per [name], pt has been taking his dilantin 350mg  (total) po in the am for the last several months.    Needs order sent to pharmacy to relay this.  Redid order and will fax to [phone].

## 2015-01-12 ENCOUNTER — Other Ambulatory Visit: Payer: Self-pay

## 2015-01-12 NOTE — Telephone Encounter (Signed)
Janett Billow called to inquire if RX had been sent to pharmacy with directions that read: 3 tablets taken at 7am. She sts pt refuses meds at night so It has been switched to am by Dr Ginette Pitman. Please call her when this is complete at [phone] x [phone]

## 2015-01-12 NOTE — Telephone Encounter (Signed)
The facility wants another Rx written that specifically says "7am"

## 2015-01-12 NOTE — Telephone Encounter (Signed)
It appears [facility] already sent a new Rx.  I called back.  Spoke with Janett Billow.  She said they need it rewritten again with "at 7am" added to the Rx.  Since patient is Medicaid, a written Rx is required.  Request forwarded to provider for signature.

## 2015-01-13 MED ORDER — PHENYTOIN SODIUM EXTENDED 100 MG PO CAPS: 300.0000 mg | ORAL_CAPSULE | Freq: Every day | ORAL | Status: DC

## 2015-01-13 NOTE — Telephone Encounter (Signed)
Rx has been signed and faxed  

## 2015-10-24 ENCOUNTER — Other Ambulatory Visit: Payer: Self-pay | Admitting: *Deleted

## 2015-10-24 MED ORDER — PHENYTOIN 50 MG PO CHEW: 50.0000 mg | CHEWABLE_TABLET | Freq: Every day | ORAL | 3 refills | Status: DC

## 2015-10-24 NOTE — Telephone Encounter (Signed)
appt 11-30-15, has been consistent on annual basis.

## 2015-10-24 NOTE — Telephone Encounter (Signed)
Received fax confirmation to [phone].

## 2015-11-03 ENCOUNTER — Telehealth: Payer: Self-pay | Admitting: Neurology

## 2015-11-03 NOTE — Telephone Encounter (Signed)
Spoke to [place].  I will call pcp and see if and can dilantin level.  Last note states 11-16-14.  Confirmed dose of dilantin 350mg  daily. (both providers Dr. Krista Blue and CM/NP in our practice).

## 2015-11-03 NOTE — Telephone Encounter (Signed)
[name], [place] called to advise, she does chart reviews in [facility] and noticed PCP does labs on this patient, however [name] didn't see lab results for Phenytoin, also advised Dilantin 300 MG daily is prescribed by other Provider. Please call.

## 2015-11-04 NOTE — Telephone Encounter (Signed)
I called and spoke to [facility] [phone].  Pt see's Dr. Ginette Pitman IM for his ofv and labs.  He has not had a dilantin level since 06-01-13 [facility] in care everywhere.  They have not done a dilantin / phenytoin level at there office from there records.  Due to have lab drawn on 11-23-15 by Dr. Ginette Pitman and then will see him on 12-07-15.  His phone # is [phone].  Did you want to get a level drawn?

## 2015-11-07 ENCOUNTER — Other Ambulatory Visit: Payer: Self-pay | Admitting: *Deleted

## 2015-11-07 DIAGNOSIS — R569 Unspecified convulsions: Secondary | ICD-10-CM

## 2015-11-07 NOTE — Telephone Encounter (Signed)
Placed order in system.  Need to call Dr. Linton Ham office and see if they will draw with other labs.

## 2015-11-07 NOTE — Telephone Encounter (Signed)
Yes , patient requires sedation for lab draws.

## 2015-11-08 NOTE — Telephone Encounter (Signed)
Faxed request for labs to Dr. Linton Ham office.

## 2015-11-08 NOTE — Telephone Encounter (Signed)
LMVM for caregiver to call back re: to labs requested.

## 2015-11-10 NOTE — Telephone Encounter (Signed)
See other order entry note.  Faxed request to [facility] for labs to take to Dr. Linton Ham office.

## 2015-11-10 NOTE — Progress Notes (Signed)
I spoke to vickie, caregiver for pt.  I told her that we wanted to get labs drawn when pt in to see Dr. Ginette Pitman.  Phenytoin level (trough level) since he did not have a recent one drawn.  I faxed request to her with this order plus (if they drew for CBC w d/plt, CMP to get those results).  [place], forward to [place].  Received fax confirmation.

## 2015-11-21 NOTE — Telephone Encounter (Signed)
[name] [phone] called to request order for Dilantin level be faxed to [facility] Fax [phone].

## 2015-11-21 NOTE — Telephone Encounter (Signed)
Refaxed again to [facility] at [phone].  Fax confirmation received.

## 2015-11-30 ENCOUNTER — Ambulatory Visit (INDEPENDENT_AMBULATORY_CARE_PROVIDER_SITE_OTHER): Payer: Medicare Other | Admitting: Nurse Practitioner

## 2015-11-30 ENCOUNTER — Encounter: Payer: Self-pay | Admitting: Nurse Practitioner

## 2015-11-30 VITALS — BP 107/68 | HR 78 | Ht 67.0 in | Wt 169.4 lb

## 2015-11-30 DIAGNOSIS — G40309 Generalized idiopathic epilepsy and epileptic syndromes, not intractable, without status epilepticus: Secondary | ICD-10-CM

## 2015-11-30 MED ORDER — PHENYTOIN 50 MG PO CHEW: 50.0000 mg | CHEWABLE_TABLET | Freq: Every day | ORAL | 3 refills | Status: DC

## 2015-11-30 MED ORDER — PHENYTOIN SODIUM EXTENDED 100 MG PO CAPS: 300.0000 mg | ORAL_CAPSULE | Freq: Every day | ORAL | 3 refills | Status: DC

## 2015-11-30 NOTE — Progress Notes (Signed)
Fax confirmation received dilantin (100mg  and 50mg ).  [phone]

## 2015-11-30 NOTE — Patient Instructions (Addendum)
Continue Dilantin at current dose  F/U yearly

## 2015-11-30 NOTE — Progress Notes (Signed)
[facility]  PATIENT: ARCENIO BHOLA DOB: [birth year]   REASON FOR VISIT: Follow-up for generalized epilepsy  HISTORY FROM: Patient and c/g from group home    HISTORY OF PRESENT ILLNESS:Mr. Borras, 45 year old male returns for yearly followup. He has a history of generalized epilepsy and is currently taking Dilantin  350 mg daily. He currently takes his Dilantin in the mornings according to the caregiver from his group home. He has not had any further seizure activity since September 2014. He also has autism. He had admission to behavioral health in January 2015 for behavior management. Appetite is good, sleeping well. Lives in a group home. He goes to a day program Monday through Friday. Recent labs were drawn at PCP to include Dilantin level which was 10, CBC and CMP WNL.   He requires sedation for labs. He returns for reevaluation.   HISTORY: of autism and seizures. The patient had a seizure sometime within the last month. According to the caretaker, the patient missed several doses of Dilantin around the time of the seizures. When he was seen in October 2013, the patient was on 200 mg of Dilantin twice daily. The blood level revealed a Dilantin level of 23, but the patient was tolerating this dose well, and the Dilantin dosing was not changed. At some point, the Dilantin dosing was reduced to 300 mg daily. The patient returns for an evaluation.     REVIEW OF SYSTEMS: Full 14 system review of systems performed and notable only for those listed, all others are neg:  Constitutional: neg  Cardiovascular: neg Ear/Nose/Throat: neg  Skin: neg Eyes: neg Respiratory: neg Gastroitestinal: neg  Hematology/Lymphatic: neg  Endocrine: neg Musculoskeletal:neg Allergy/Immunology: neg Neurological: History of seizure disorder, autism, migraines Psychiatric:  Sleep : neg   ALLERGIES: No Known Allergies  HOME MEDICATIONS: Outpatient Medications Prior to Visit    Medication Sig Dispense Refill  . atorvastatin (LIPITOR) 10 MG tablet Take 10 mg by mouth daily.    . cholecalciferol (VITAMIN D) 1000 UNITS tablet Take 1 tablet (1,000 Units total) by mouth daily. 90 tablet 1  . cloNIDine (CATAPRES - DOSED IN MG/24 HR) 0.2 mg/24hr patch Place 1 patch onto the skin once a week.    Marland Kitchen dextromethorphan (DELSYM) 30 MG/5ML liquid Take 30 mg by mouth as needed for cough.    . diazepam (VALIUM) 10 MG tablet Take 1 tablet (10 mg total) by mouth once. Prior to lab draw 1 tablet 0  . hydrocortisone (ANUSOL-HC) 2.5 % rectal cream Place 2.5 application rectally as needed.     . hydrocortisone 2.5 % cream Apply 2.5 application topically as needed.     Marland Kitchen ketoconazole (NIZORAL) 2 % shampoo Apply 2 application topically daily.    Marland Kitchen LORazepam (ATIVAN) 1 MG tablet Take 1 mg by mouth daily.    . phenytoin (DILANTIN) 100 MG ER capsule Take 3 capsules (300 mg total) by mouth daily. At 7 am 270 capsule 3  . phenytoin (DILANTIN) 50 MG tablet Chew 1 tablet (50 mg total) by mouth daily. 90 tablet 3  . polyethylene glycol powder (GLYCOLAX/MIRALAX) powder Take 17 g by mouth as needed (Tuesday and Thursday).     . risperiDONE (RISPERDAL) 0.5 MG tablet Take 0.5 mg by mouth 3 (three) times daily.     . SUMAtriptan (IMITREX) 25 MG tablet Take 25 mg by mouth as needed for migraine. May repeat in 2 hours if headache persists or recurs.    . fexofenadine (ALLEGRA) 180 MG  tablet Take by mouth.     No facility-administered medications prior to visit.     PAST MEDICAL HISTORY: Past Medical History:  Diagnosis Date  . Anxiety disorder   . Autism   . Dyslipidemia   . Psychosis   . Seizures ([place])     PAST SURGICAL HISTORY: History reviewed. No pertinent surgical history.  FAMILY HISTORY: History reviewed. No pertinent family history.  SOCIAL HISTORY: Social History   Social History  . Marital status: Unknown    Spouse name: N/A  . Number of children: 0  . Years of education:  N/A   Occupational History  . Not on file.   Social History Main Topics  . Smoking status: Never Smoker  . Smokeless tobacco: Never Used  . Alcohol use No  . Drug use: No  . Sexual activity: Not on file   Other Topics Concern  . Not on file   Social History Narrative  . No narrative on file     PHYSICAL EXAM  Vitals:   11/30/15 1010  BP: 107/68  Pulse: 78  Weight: 169 lb 6.4 oz (76.8 kg)  Height: 5\' 7"  (1.702 m)   Body mass index is 26.53 kg/m. Generalized: Well developed, in no acute distress  Neurological examination  Mentation: Alert oriented to time, place, history taking. Follows most commands speech and language fluent  Cranial nerve II-XII: .Pupils were equal round reactive to light extraocular movements were full, visual field were full on confrontational test. Facial sensation and strength were normal. hearing was intact to finger rubbing bilaterally. Uvula tongue midline. head turning and shoulder shrug were normal and symmetric.Tongue protrusion into cheek strength was normal.  Motor: normal bulk and tone, full strength in the BUE, BLE, . No focal weakness  Coordination: finger-nose-finger, heel-to-shin bilaterally, apraxia with the use of lower extremities  Reflexes: Brachioradialis 2/2, biceps 2/2, triceps 2/2, patellar 2/2, Achilles 2/2, plantar responses were flexor bilaterally.  Gait and Station: Rising up from seated position without assistance, normal stance, moderate stride, good arm swing, smooth turning Tandem gait is apraxic   DIAGNOSTIC DATA (LABS, IMAGING, TESTING) -  ASSESSMENT AND PLAN  45 y.o. year old male  has a past medical history of generalized seizures; Autism; Psychosis; Dyslipidemia; and Anxiety disorder. here to follow-up. Recent labs at primary care ,  On 11/23/15 Dilantin level 10, CBC and CMP WNL.  He requires sedation for labs.  Continue Dilantin at current dose will renew for one year Call for any seizure  activity Follow-up yearly and when necessary Dennie Bible, [facility], [facility], [facility] [address], [place], [place] [address] [phone]

## 2015-11-30 NOTE — Progress Notes (Signed)
I have read the note, and I agree with the clinical assessment and plan.  Tobechukwu Emmick KEITH   

## 2016-09-14 ENCOUNTER — Emergency Department: Payer: Medicare Other

## 2016-09-14 ENCOUNTER — Emergency Department
Admission: EM | Admit: 2016-09-14 | Discharge: 2016-09-14 | Disposition: A | Discharge status: Home / Self Care | Payer: Medicare Other | Attending: Emergency Medicine | Admitting: Emergency Medicine

## 2016-09-14 ENCOUNTER — Encounter: Payer: Self-pay | Admitting: Emergency Medicine

## 2016-09-14 DIAGNOSIS — Z79899 Other long term (current) drug therapy: Secondary | ICD-10-CM | POA: Diagnosis not present

## 2016-09-14 DIAGNOSIS — R569 Unspecified convulsions: Secondary | ICD-10-CM | POA: Insufficient documentation

## 2016-09-14 DIAGNOSIS — F840 Autistic disorder: Secondary | ICD-10-CM | POA: Diagnosis not present

## 2016-09-14 LAB — CBC WITH DIFFERENTIAL/PLATELET
Basophils Absolute: 0 10*3/uL (ref 0–0.1)
Basophils Relative: 0 %
Eosinophils Absolute: 0 10*3/uL (ref 0–0.7)
Eosinophils Relative: 0 %
HEMATOCRIT: 39.1 % — AB (ref 40.0–52.0)
HEMOGLOBIN: 13.5 g/dL (ref 13.0–18.0)
LYMPHS ABS: 2.1 10*3/uL (ref 1.0–3.6)
Lymphocytes Relative: 29 %
MCH: 34.7 pg — AB (ref 26.0–34.0)
MCHC: 34.5 g/dL (ref 32.0–36.0)
MCV: 100.5 fL — AB (ref 80.0–100.0)
MONOS PCT: 9 %
Monocytes Absolute: 0.6 10*3/uL (ref 0.2–1.0)
NEUTROS ABS: 4.4 10*3/uL (ref 1.4–6.5)
NEUTROS PCT: 62 %
Platelets: 265 10*3/uL (ref 150–440)
RBC: 3.89 MIL/uL — ABNORMAL LOW (ref 4.40–5.90)
RDW: 12.9 % (ref 11.5–14.5)
WBC: 7.1 10*3/uL (ref 3.8–10.6)

## 2016-09-14 LAB — BASIC METABOLIC PANEL
Anion gap: 8 (ref 5–15)
BUN: 9 mg/dL (ref 6–20)
CHLORIDE: 105 mmol/L (ref 101–111)
CO2: 27 mmol/L (ref 22–32)
CREATININE: 0.93 mg/dL (ref 0.61–1.24)
Calcium: 9.3 mg/dL (ref 8.9–10.3)
GFR calc non Af Amer: >60 mL/min (ref >60)
GLUCOSE: 89 mg/dL (ref 65–99)
Potassium: 4.2 mmol/L (ref 3.5–5.1)
Sodium: 140 mmol/L (ref 135–145)

## 2016-09-14 LAB — PHENYTOIN LEVEL, TOTAL: Phenytoin Lvl: 2.9 ug/mL — ABNORMAL LOW (ref 10.0–20.0)

## 2016-09-14 MED ORDER — RISPERIDONE 1 MG PO TABS
0.5000 mg | ORAL_TABLET | Freq: Once | ORAL | Status: AC
  Administered 2016-09-14: 0.5 mg via ORAL
  Filled 2016-09-14: qty 1

## 2016-09-14 MED ORDER — PHENYTOIN SODIUM 50 MG/ML IJ SOLN
1000.0000 mg | Freq: Once | INTRAMUSCULAR | Status: AC
  Administered 2016-09-14: 1000 mg via INTRAVENOUS
  Filled 2016-09-14: qty 20

## 2016-09-14 NOTE — Discharge Instructions (Signed)
Please seek medical attention for any high fevers, chest pain, shortness of breath, change in behavior, persistent vomiting, bloody stool or any other new or concerning symptoms.  

## 2016-09-14 NOTE — ED Provider Notes (Signed)
[facility] Emergency Department Provider Note  ____________________________________________   I have reviewed the triage vital signs and the nursing notes.   HISTORY  Chief Complaint Seizures   History limited by: Intellectual disability   HPI [name] is a 46 y.o. male who presents to the emergency department today via EMS after apparent seizure. It was witness by coworkers. They state it lasted roughly 5 minutes. Patient did hit his head. The patient is on dilantin. The patient unfortunately cannot give any history of events. Does state he has been taking his medication regularly. Per chart review patient does have a history of seizures. Per note dated 06/05/2016 last seizure was August 2014.   Past Medical History:  Diagnosis Date  . Anxiety disorder   . Autism   . Dyslipidemia   . Psychosis   . Seizures [facility])     Patient Active Problem List   Diagnosis Date Noted  . Anxiety 11/30/2014  . Headache, migraine 08/07/2013  . Hypertriglyceridemia 08/07/2013  . Clinical depression 08/07/2013  . Allergic rhinitis 08/07/2013  . 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 No pertinent surgical history.  Prior to Admission medications   Medication Sig Start Date End Date Taking? Authorizing Provider  atorvastatin (LIPITOR) 10 MG tablet Take 10 mg by mouth daily.    [provider]  cholecalciferol (VITAMIN D) 1000 UNITS tablet Take 1 tablet (1,000 Units total) by mouth daily. 11/14/12   Kathrynn Ducking, MD  cloNIDine (CATAPRES - DOSED IN MG/24 HR) 0.2 mg/24hr patch Place 1 patch onto the skin once a week.    [provider]  dextromethorphan (DELSYM) 30 MG/5ML liquid Take 30 mg by mouth as needed for cough.    [provider]  diazepam (VALIUM) 10 MG tablet Take 1 tablet (10 mg total) by mouth once. Prior to lab draw 11/24/13   Dennie Bible, NP  fexofenadine [facility]) 180 MG tablet  Take by mouth. 10/07/13 10/07/14  [provider]  hydrocortisone (ANUSOL-HC) 2.5 % rectal cream Place 2.5 application rectally as needed.     [provider]  hydrocortisone 2.5 % cream Apply 2.5 application topically as needed.  10/08/12   [provider]  ketoconazole (NIZORAL) 2 % shampoo Apply 2 application topically daily. 11/14/12   [provider]  LORazepam (ATIVAN) 1 MG tablet Take 1 mg by mouth daily.    [provider]  phenytoin (DILANTIN) 100 MG ER capsule Take 3 capsules (300 mg total) by mouth daily. At 7 am 11/30/15   Dennie Bible, NP  phenytoin (DILANTIN) 50 MG tablet Chew 1 tablet (50 mg total) by mouth daily. At 7am 11/30/15   Dennie Bible, NP  polyethylene glycol powder [facility]) powder Take 17 g by mouth as needed (Tuesday and Thursday).  10/27/12   [provider]  risperiDONE (RISPERDAL) 0.5 MG tablet Take 0.5 mg by mouth 3 (three) times daily.  05/09/13   [provider]  SUMAtriptan (IMITREX) 25 MG tablet Take 25 mg by mouth as needed for migraine. May repeat in 2 hours if headache persists or recurs.    [provider]    Allergies Patient has no known allergies.  No family history on file.  Social History Social History  Substance Use Topics  . Smoking status: Never Smoker  . Smokeless tobacco: Never Used  . Alcohol use No    Review of Systems Constitutional: No fever/chills Eyes: No visual changes. ENT:  No sore throat. Cardiovascular: Denies chest pain. Respiratory: Denies shortness of breath. Gastrointestinal: No abdominal pain.  No nausea, no vomiting.  No diarrhea.   Genitourinary: Negative for dysuria. Musculoskeletal: Negative for back pain. Skin: Negative for rash. Neurological: Negative for headaches, focal weakness or numbness.  ____________________________________________   PHYSICAL EXAM:  VITAL SIGNS: ED Triage Vitals [09/14/16 1542]  Enc Vitals  Group     BP      Pulse      Resp      Temp      Temp src      SpO2      Weight 165 lb (74.8 kg)     Height 5\' 8"  (1.727 m)     Head Circumference      Peak Flow      Pain Score      Pain Loc      Pain Edu?      Excl. in [place]?      Constitutional: Awake and alert. No acute distress. Eyes: Conjunctivae are normal.  ENT   Head: Normocephalic. Hematoma to left forehead.   Nose: No congestion/rhinnorhea.   Mouth/Throat: Mucous membranes are moist.   Neck: No stridor. No midline tenderness.  Hematological/Lymphatic/Immunilogical: No cervical lymphadenopathy. Cardiovascular: Normal rate, regular rhythm.  No murmurs, rubs, or gallops.  Respiratory: Normal respiratory effort without tachypnea nor retractions. Breath sounds are clear and equal bilaterally. No wheezes/rales/rhonchi. Gastrointestinal: Soft and non tender. No rebound. No guarding.  Genitourinary: Deferred Musculoskeletal: Normal range of motion in all extremities. No lower extremity edema. Neurologic:  Intellectual disability. Moves all extremities. Sensation intact.  Skin:  Skin is warm, dry and intact. No rash noted. ____________________________________________    LABS (pertinent positives/negatives)  Labs Reviewed  CBC WITH DIFFERENTIAL/PLATELET - Abnormal; Notable for the following:       Result Value   RBC 3.89 (*)    HCT 39.1 (*)    MCV 100.5 (*)    MCH 34.7 (*)    All other components within normal limits  PHENYTOIN LEVEL, TOTAL - Abnormal; Notable for the following:    Phenytoin Lvl 2.9 (*)    All other components within normal limits  BASIC METABOLIC PANEL     ____________________________________________   EKG  None  ____________________________________________    RADIOLOGY  CT head IMPRESSION: 1. Mild frontal and parietal lobe atrophy for age.  2. No intracranial mass, hemorrhage, or extra-axial fluid collection. Gray-white compartments appear normal.  3. Small left  frontal scalp hematoma. No fracture evident.  4. There are areas of paranasal sinus disease.  ____________________________________________   PROCEDURES  Procedures  ____________________________________________   INITIAL IMPRESSION / ASSESSMENT AND PLAN / ED COURSE  Pertinent labs & imaging results that were available during my care of the patient were reviewed by me and considered in my medical decision making (see chart for details).  Patient presented to the emergency department today because of concern for seizure. History of seizure disorder. Dilantin level subtheraputic here. Will give dose of IV phenytoin. Discussed importance of follow up and recheck of dilantin level with caregiver.   ____________________________________________   FINAL CLINICAL IMPRESSION(S) / ED DIAGNOSES  Final diagnoses:  Seizure ([place])     Note: This dictation was prepared with Dragon dictation. Any transcriptional errors that result from this process are unintentional     Nance Pear, MD 09/14/16 2005

## 2016-09-14 NOTE — ED Triage Notes (Signed)
Patient from work via [employer]. Per EMS, patient had a witnessed seizure, lasting approximately 5 minutes. Patient and staff denies history of seizure. Patient has hematoma above right eye. Patient has history of autism and is oriented at baseline per coworkers. Patient denies pain or discomfort.

## 2016-09-18 ENCOUNTER — Other Ambulatory Visit
Admission: RE | Admit: 2016-09-18 | Discharge: 2016-09-18 | Disposition: A | Discharge status: Home / Self Care | Payer: Medicare Other | Source: Ambulatory Visit | Attending: Internal Medicine | Admitting: Internal Medicine

## 2016-09-18 DIAGNOSIS — G43009 Migraine without aura, not intractable, without status migrainosus: Secondary | ICD-10-CM | POA: Insufficient documentation

## 2016-09-18 LAB — PHENYTOIN LEVEL, TOTAL: PHENYTOIN LVL: 12.9 ug/mL (ref 10.0–20.0)

## 2016-09-19 ENCOUNTER — Ambulatory Visit (INDEPENDENT_AMBULATORY_CARE_PROVIDER_SITE_OTHER): Payer: Medicare Other | Admitting: Nurse Practitioner

## 2016-09-19 ENCOUNTER — Encounter: Payer: Self-pay | Admitting: Nurse Practitioner

## 2016-09-19 VITALS — BP 114/73 | HR 105 | Wt 168.8 lb

## 2016-09-19 DIAGNOSIS — G40309 Generalized idiopathic epilepsy and epileptic syndromes, not intractable, without status epilepticus: Secondary | ICD-10-CM

## 2016-09-19 MED ORDER — PHENYTOIN SODIUM EXTENDED 100 MG PO CAPS: 300.0000 mg | ORAL_CAPSULE | Freq: Every day | ORAL | 3 refills | Status: DC

## 2016-09-19 MED ORDER — PHENYTOIN 50 MG PO CHEW: 50.0000 mg | CHEWABLE_TABLET | Freq: Every day | ORAL | 3 refills | Status: DC

## 2016-09-19 NOTE — Progress Notes (Signed)
I have read the note, and I agree with the clinical assessment and plan.  [name],CHARLES KEITH   

## 2016-09-19 NOTE — Progress Notes (Signed)
[facility]  PATIENT: [name] DOB: 07-04-1970   REASON FOR VISIT: Follow-up for generalized epilepsy  HISTORY FROM: Patient and c/g from group home    HISTORY OF PRESENT ILLNESS:Mr. Kasinger, 46 year old male returns for  followup. He has a history of generalized epilepsy and is currently taking Dilantin  350 mg daily. He had a seizure on 09/14/2016 and Dilantin level was drawn and it was 2.6. CBC and BMP were stable. On questioning the caregivers from group home they only give him his medication are not sure that he actually takes it. His last seizure activity prior was  September 2014. He had repeat Dilantin level drawn yesterday and it was 12.6 He also has autism. He had admission to behavioral health in January 2015 for behavior management. Appetite is good, sleeping well. Lives in a group home. He goes to a day program Monday through Friday.   He requires sedation for labs. He returns for reevaluation.   HISTORY: of autism and seizures. The patient had a seizure sometime within the last month. According to the caretaker, the patient missed several doses of Dilantin around the time of the seizures. When he was seen in October 2013, the patient was on 200 mg of Dilantin twice daily. The blood level revealed a Dilantin level of 23, but the patient was tolerating this dose well, and the Dilantin dosing was not changed. At some point, the Dilantin dosing was reduced to 300 mg daily. The patient returns for an evaluation.     REVIEW OF SYSTEMS: Full 14 system review of systems performed and notable only for those listed, all others are neg:  Constitutional: neg  Cardiovascular: neg Ear/Nose/Throat: neg  Skin: neg Eyes: neg Respiratory: neg Gastroitestinal: neg  Hematology/Lymphatic: neg  Endocrine: neg Musculoskeletal:neg Allergy/Immunology: neg Neurological: History of seizure disorder, autism, migraines Psychiatric:  Sleep : neg   ALLERGIES: No  Known Allergies  HOME MEDICATIONS: Outpatient Medications Prior to Visit  Medication Sig Dispense Refill  . cholecalciferol (VITAMIN D) 1000 UNITS tablet Take 1 tablet (1,000 Units total) by mouth daily. 90 tablet 1  . cloNIDine (CATAPRES - DOSED IN MG/24 HR) 0.2 mg/24hr patch Place 1 patch onto the skin once a week.    . diazepam (VALIUM) 10 MG tablet Take 1 tablet (10 mg total) by mouth once. Prior to lab draw 1 tablet 0  . ketoconazole (NIZORAL) 2 % shampoo Apply 2 application topically daily.    Marland Kitchen LORazepam (ATIVAN) 1 MG tablet Take 1 mg by mouth daily.    . phenytoin (DILANTIN) 100 MG ER capsule Take 3 capsules (300 mg total) by mouth daily. At 7 am (Patient taking differently: Take 300 mg by mouth at bedtime. ) 270 capsule 3  . phenytoin (DILANTIN) 50 MG tablet Chew 1 tablet (50 mg total) by mouth daily. At 7am 90 tablet 3  . polyethylene glycol powder (GLYCOLAX/MIRALAX) powder Take 17 g by mouth as needed (Tuesday and Thursday).     . busPIRone (BUSPAR) 15 MG tablet Take 30 mg by mouth 2 (two) times daily.    Marland Kitchen dextromethorphan (DELSYM) 30 MG/5ML liquid Take 30 mg by mouth every 12 (twelve) hours as needed for cough.     . fenofibrate (TRICOR) 145 MG tablet Take 145 mg by mouth daily.    . fexofenadine (ALLEGRA) 180 MG tablet Take by mouth.    . hydrocortisone (ANUSOL-HC) 2.5 % rectal cream Place 2.5 application rectally 3 (three) times daily as needed for hemorrhoids or  anal itching.     . hydrocortisone 2.5 % cream Apply 2.5 application topically 3 (three) times daily as needed (RASH).      No facility-administered medications prior to visit.     PAST MEDICAL HISTORY: Past Medical History:  Diagnosis Date  . Anxiety disorder   . Autism   . Dyslipidemia   . Psychosis   . Seizures ([place])    most recent sz 09/14/16    PAST SURGICAL HISTORY: History reviewed. No pertinent surgical history.  FAMILY HISTORY: History reviewed. No pertinent family history.  SOCIAL  HISTORY: Social History   Social History  . Marital status: Single    Spouse name: N/A  . Number of children: 0  . Years of education: N/A   Occupational History  . Not on file.   Social History Main Topics  . Smoking status: Never Smoker  . Smokeless tobacco: Never Used  . Alcohol use No  . Drug use: No  . Sexual activity: Not on file   Other Topics Concern  . Not on file   Social History Narrative  . No narrative on file     PHYSICAL EXAM  Vitals:   09/19/16 1404  BP: 114/73  Pulse: (!) 105  Weight: 168 lb 12.8 oz (76.6 kg)   Body mass index is 25.67 kg/m. Generalized: Well developed, in no acute distress  Neurological examination  Mentation: Alert oriented to time, place, history taking. Follows most commands speech and language fluent  Cranial nerve II-XII: .Pupils were equal round reactive to light extraocular movements were full, visual field were full on confrontational test. Facial sensation and strength were normal. hearing was intact to finger rubbing bilaterally. Uvula tongue midline. head turning and shoulder shrug were normal and symmetric.Tongue protrusion into cheek strength was normal.  Motor: normal bulk and tone, full strength in the BUE, BLE, . No focal weakness  Coordination: finger-nose-finger, heel-to-shin bilaterally, apraxia with the use of lower extremities  Reflexes: Brachioradialis 2/2, biceps 2/2, triceps 2/2, patellar 2/2, Achilles 2/2, plantar responses were flexor bilaterally.  Gait and Station: Rising up from seated position without assistance, normal stance, moderate stride, good arm swing, smooth turning Tandem gait is apraxic   DIAGNOSTIC DATA (LABS, IMAGING, TESTING) -  ASSESSMENT AND PLAN  46 y.o. year old male  has a past medical history of generalized seizures; Autism; Psychosis; Dyslipidemia; and Anxiety disorder. here to follow-up.He had a seizure on 09/14/2016 with Dilantin level 2.6. Repeat level on 09/18/16  was  12.6. CBC and BMP stable.  He requires sedation for labs.  Continue Dilantin at current dose will renew for one year Dilantin level yesterday 12.9BMP and CBC stable Made  caregivers aware they  need to make sure he takes his medication Call for any seizure activity Follow-up yearly and when necessary,next with Dr. Jerline Pain, [facility], [facility], APRN  [facility] [address], [place], [place] [address] [phone]

## 2016-09-19 NOTE — Patient Instructions (Signed)
Continue Dilantin at current dose will renew for one year Dilantin level yesterday 12.9 Call for any seizure activity Follow-up yearly and when necessary,next with Dr. Jannifer Franklin

## 2016-09-20 NOTE — Progress Notes (Signed)
Fax confirmation received dilantin 50mg  tabs and 100mg  caps.  [facility] [phone]. sy

## 2016-11-29 ENCOUNTER — Ambulatory Visit: Payer: Medicare Other | Admitting: Nurse Practitioner

## 2017-06-10 ENCOUNTER — Other Ambulatory Visit
Admission: RE | Admit: 2017-06-10 | Discharge: 2017-06-10 | Disposition: A | Discharge status: Home / Self Care | Payer: Medicare Other | Source: Ambulatory Visit | Attending: Internal Medicine | Admitting: Internal Medicine

## 2017-06-10 DIAGNOSIS — E781 Pure hyperglyceridemia: Secondary | ICD-10-CM | POA: Insufficient documentation

## 2017-06-10 DIAGNOSIS — F419 Anxiety disorder, unspecified: Secondary | ICD-10-CM | POA: Diagnosis present

## 2017-06-10 DIAGNOSIS — F329 Major depressive disorder, single episode, unspecified: Secondary | ICD-10-CM | POA: Insufficient documentation

## 2017-06-10 DIAGNOSIS — G40909 Epilepsy, unspecified, not intractable, without status epilepticus: Secondary | ICD-10-CM | POA: Insufficient documentation

## 2017-06-10 LAB — PHENYTOIN LEVEL, TOTAL: Phenytoin Lvl: 11.1 ug/mL (ref 10.0–20.0)

## 2017-09-24 ENCOUNTER — Ambulatory Visit: Payer: Medicare Other | Admitting: Neurology

## 2017-10-08 ENCOUNTER — Other Ambulatory Visit: Payer: Self-pay | Admitting: Nurse Practitioner

## 2017-10-17 ENCOUNTER — Encounter: Payer: Self-pay | Admitting: Neurology

## 2017-10-17 ENCOUNTER — Ambulatory Visit (INDEPENDENT_AMBULATORY_CARE_PROVIDER_SITE_OTHER): Payer: Medicare Other | Admitting: Neurology

## 2017-10-17 VITALS — BP 122/80 | HR 82 | Ht 68.0 in | Wt 173.5 lb

## 2017-10-17 DIAGNOSIS — G40309 Generalized idiopathic epilepsy and epileptic syndromes, not intractable, without status epilepticus: Secondary | ICD-10-CM

## 2017-10-17 MED ORDER — DILANTIN 100 MG PO CAPS: ORAL_CAPSULE | ORAL | 11 refills | Status: DC

## 2017-10-17 MED ORDER — DILANTIN INFATABS 50 MG PO CHEW: CHEWABLE_TABLET | ORAL | 11 refills | Status: DC

## 2017-10-17 NOTE — Progress Notes (Signed)
Reason for visit: Seizures  [name] is an 47 y.o. male  History of present illness:  [name] is a 47 year old right-handed white male with a history of autism and seizures.  He has done quite well over the last year without any recurrent seizure events.  The patient remains on Dilantin, he also takes vitamin D supplementation.  The patient tolerates the medication quite well.  He returns for an evaluation.  Past Medical History:  Diagnosis Date  . Anxiety disorder   . Autism   . Dyslipidemia   . Psychosis ([place])   . Seizures ([place])    most recent sz 09/14/16    History reviewed. No pertinent surgical history.  History reviewed. No pertinent family history.  Social history:  reports that he has never smoked. He has never used smokeless tobacco. He reports that he does not drink alcohol or use drugs.   No Known Allergies  Medications:  Prior to Admission medications   Medication Sig Start Date End Date Taking? Authorizing Provider  acetaminophen (TYLENOL) 650 MG CR tablet Take 650 mg by mouth every 8 (eight) hours as needed for pain.   Yes [provider]  atorvastatin (LIPITOR) 10 MG tablet Take 10 mg by mouth daily.   Yes [provider]  benzonatate (TESSALON) 200 MG capsule Take 200 mg by mouth 3 (three) times daily as needed for cough.   Yes [provider]  cetirizine (ZYRTEC) 10 MG tablet Take 10 mg by mouth daily.   Yes [provider]  chlorhexidine (PERIDEX) 0.12 % solution  09/14/16  Yes [provider]  chlorproMAZINE (THORAZINE) 50 MG tablet Take 50 mg by mouth 3 (three) times daily as needed. Max 3 doses in 24 hrs as needed   Yes [provider]  cholecalciferol (VITAMIN D) 1000 UNITS tablet Take 1 tablet (1,000 Units total) by mouth daily. 11/14/12  Yes Kathrynn Ducking, MD  cloNIDine (CATAPRES - DOSED IN MG/24 HR) 0.2 mg/24hr patch Place 1 patch onto the skin once a week.   Yes [provider]  DILANTIN 100 MG ER capsule TAKE 3 CAPSULES (300MG ) BY MOUTH AT 7AM FOR SEIZURES. *BRAND NAME MEDICALLY REQUIRED* 10/17/17  Yes Kathrynn Ducking, MD  DILANTIN INFATABS 50 MG tablet CHEW 1 TABLET BY MOUTH EVERY DAY *BRAND NAME MEDICALLY NECESSARY* 10/17/17  Yes Kathrynn Ducking, MD  fluticasone [facility]) 50 MCG/ACT nasal spray Place 1 spray into both nostrils daily.   Yes [provider]  hydrocortisone (ANUSOL-HC) 2.5 % rectal cream Place 2.5 application rectally 3 (three) times daily as needed for hemorrhoids or anal itching.    Yes [provider]  hydrocortisone 2.5 % cream Apply 2.5 application topically 3 (three) times daily as needed (RASH).  10/08/12  Yes [provider]  ketoconazole (NIZORAL) 2 % shampoo Apply 2 application topically daily. 11/14/12  Yes [provider]  LORazepam (ATIVAN) 1 MG tablet Take 1 mg by mouth daily.   Yes [provider]  mometasone (ELOCON) 0.1 % cream Apply 1 application topically daily.   Yes [provider]  ondansetron (ZOFRAN-ODT) 4 MG disintegrating tablet TAKE ONE TABLET BY MOUTH EVERY 8 HOURS AS NEEDED FOR NAUSEA 03/27/16  Yes [provider]  polyethylene glycol (MIRALAX / GLYCOLAX) packet Mix one packet in 4 ounces of water on Tuesday and Thursday for constipation. 07/08/15  Yes [provider]  risperiDONE (RISPERDAL) 0.5 MG tablet Take 0.75 mg by mouth 3 (three)  times daily. 1.5 tabs po TID   Yes [provider]  Sodium Fluoride (PREVIDENT 5000 PLUS DT) Place 1 Dose onto teeth as needed.   Yes [provider]  SUMAtriptan (IMITREX) 25 MG tablet Take by mouth.   Yes [provider]  triamcinolone lotion (KENALOG) 0.1 % Apply 1 application topically daily.   Yes [provider]    ROS:  Out of a complete 14 system review of symptoms, the patient complains only of the following symptoms, and all other reviewed systems are  negative.  Seizure  Blood pressure 122/80, pulse 82, height 5\' 8"  (1.727 m), weight 173 lb 8 oz (78.7 kg), SpO2 98 %.  Physical Exam  General: The patient is alert and cooperative at the time of the examination.  Skin: No significant peripheral edema is noted.   Neurologic Exam  Mental status: The patient is alert and oriented x 3 at the time of the examination. The patient has apparent normal recent and remote memory, with an apparently normal attention span and concentration ability.    Cranial nerves: Facial symmetry is present. Speech is normal, no aphasia or dysarthria is noted. Extraocular movements are full. Visual fields are full.  Motor: The patient has good strength in all 4 extremities.  Sensory examination: Soft touch sensation is symmetric on the face, arms, and legs.  Coordination: The patient has good finger-nose-finger and heel-to-shin bilaterally.  The patient has some apraxia with use of the extremities.  Gait and station: The patient has a normal gait. Tandem gait is normal. Romberg is negative. No drift is seen.  Reflexes: Deep tendon reflexes are symmetric.   Assessment/Plan:  1.  History of seizures, well controlled  2.  Autism  The patient will continue the Dilantin, prescriptions were sent in for the 100 and for the 50 mg dosing of Dilantin.  The patient has already had blood work done today to include a Dilantin level, A Dilantin level done in April 2019 was 11.1.  He will follow-up in 1 year.  Jill Alexanders MD 10/17/2017 2:55 PM  [facility] [address] [place], [place] [address]  Phone [phone] Fax [phone]

## 2017-12-12 ENCOUNTER — Other Ambulatory Visit: Payer: Self-pay | Admitting: Neurology

## 2018-02-01 ENCOUNTER — Other Ambulatory Visit: Payer: Self-pay | Admitting: Neurology

## 2018-02-26 ENCOUNTER — Other Ambulatory Visit: Payer: Self-pay | Admitting: Neurology

## 2018-04-14 NOTE — Progress Notes (Signed)
[facility]  PATIENT: [name] DOB: 1971-02-23   REASON FOR VISIT: Follow-up for seizure disorder, autism HISTORY FROM: Caregiver    HISTORY OF PRESENT ILLNESS:UPDATE 2/4/2020CM [name], 48 year old male returns for follow-up with a history of autism and seizure disorder.  He has not had any seizure activity since last seen labs drawn by his primary care 04/02/2018 and his Dilantin level was 8.3.  He was sent to the office today for adjustments in his medication.  He continues to live in a group home and is with his caregiver today.  He goes to a day program.  Last seizure was September 14, 2016.  He is on brand Dilantin.  He returns for reevaluation 10/17/17 [name] is a 48 year old right-handed white male with a history of autism and seizures.  He has done quite well over the last year without any recurrent seizure events.  The patient remains on Dilantin, he also takes vitamin D supplementation.  The patient tolerates the medication quite well.  He returns for an evaluation.   REVIEW OF SYSTEMS: Full 14 system review of systems performed and notable only for those listed, all others are neg:  Constitutional: neg  Cardiovascular: neg Ear/Nose/Throat: neg  Skin: neg Eyes: neg Respiratory: neg Gastroitestinal: neg  Hematology/Lymphatic: neg  Endocrine: neg Musculoskeletal:neg Allergy/Immunology: neg Neurological: Seizure disorder autism Psychiatric: neg Sleep : neg   ALLERGIES: No Known Allergies  HOME MEDICATIONS: Outpatient Medications Prior to Visit  Medication Sig Dispense Refill  . acetaminophen (TYLENOL) 650 MG CR tablet Take 650 mg by mouth every 8 (eight) hours as needed for pain.    Marland Kitchen atorvastatin (LIPITOR) 10 MG tablet Take 10 mg by mouth daily.    . benzonatate (TESSALON) 200 MG capsule Take 200 mg by mouth 3 (three) times daily as needed for cough.    . cetirizine (ZYRTEC) 10 MG tablet Take 10 mg by mouth daily.    .  chlorhexidine (PERIDEX) 0.12 % solution     . chlorproMAZINE (THORAZINE) 50 MG tablet Take 50 mg by mouth 3 (three) times daily as needed. Max 3 doses in 24 hrs as needed    . cholecalciferol (VITAMIN D) 1000 UNITS tablet Take 1 tablet (1,000 Units total) by mouth daily. 90 tablet 1  . cloNIDine (CATAPRES - DOSED IN MG/24 HR) 0.2 mg/24hr patch Place 1 patch onto the skin once a week.    Marland Kitchen DILANTIN 100 MG ER capsule TAKE 3 CAPSULES (300MG ) BY MOUTH AT 7AM FOR SEIZURES. *BRAND NAME MEDICALLY REQUIRED* 84 capsule 0  . DILANTIN INFATABS 50 MG tablet CHEW 1 TABLET BY MOUTH EVERY DAY *BRAND NAME MEDICALLY NECESSARY* 28 tablet 0  . fluticasone (FLONASE) 50 MCG/ACT nasal spray Place 1 spray into both nostrils daily.    . hydrocortisone (ANUSOL-HC) 2.5 % rectal cream Place 2.5 application rectally 3 (three) times daily as needed for hemorrhoids or anal itching.     . hydrocortisone 2.5 % cream Apply 2.5 application topically 3 (three) times daily as needed (RASH).     Marland Kitchen ketoconazole (NIZORAL) 2 % shampoo Apply 2 application topically daily.    Marland Kitchen LORazepam (ATIVAN) 1 MG tablet Take 1 mg by mouth daily.    . mometasone (ELOCON) 0.1 % cream Apply 1 application topically daily.    . ondansetron (ZOFRAN-ODT) 4 MG disintegrating tablet TAKE ONE TABLET BY MOUTH EVERY 8 HOURS AS NEEDED FOR NAUSEA    . polyethylene glycol (MIRALAX / GLYCOLAX) packet Mix one packet in 4 ounces  of water on Tuesday and Thursday for constipation.    . risperiDONE (RISPERDAL) 0.5 MG tablet Take 0.75 mg by mouth 3 (three) times daily. 1.5 tabs po TID    . Sodium Fluoride (PREVIDENT 5000 PLUS DT) Place 1 Dose onto teeth as needed.    . SUMAtriptan (IMITREX) 25 MG tablet Take by mouth.    . triamcinolone lotion (KENALOG) 0.1 % Apply 1 application topically daily.     No facility-administered medications prior to visit.     PAST MEDICAL HISTORY: Past Medical History:  Diagnosis Date  . Anxiety disorder   . Autism   . Dyslipidemia     . Psychosis ([place])   . Seizures ([place])    most recent sz 09/14/16    PAST SURGICAL HISTORY: History reviewed. No pertinent surgical history.  FAMILY HISTORY: History reviewed. No pertinent family history.  SOCIAL HISTORY: Social History   Socioeconomic History  . Marital status: Single    Spouse name: Not on file  . Number of children: 0  . Years of education: Not on file  . Highest education level: Not on file  Occupational History  . Not on file  Social Needs  . Financial resource strain: Not on file  . Food insecurity:    Worry: Not on file    Inability: Not on file  . Transportation needs:    Medical: Not on file    Non-medical: Not on file  Tobacco Use  . Smoking status: Never Smoker  . Smokeless tobacco: Never Used  Substance and Sexual Activity  . Alcohol use: No  . Drug use: No  . Sexual activity: Not on file  Lifestyle  . Physical activity:    Days per week: Not on file    Minutes per session: Not on file  . Stress: Not on file  Relationships  . Social connections:    Talks on phone: Not on file    Gets together: Not on file    Attends religious service: Not on file    Active member of club or organization: Not on file    Attends meetings of clubs or organizations: Not on file    Relationship status: Not on file  . Intimate partner violence:    Fear of current or ex partner: Not on file    Emotionally abused: Not on file    Physically abused: Not on file    Forced sexual activity: Not on file  Other Topics Concern  . Not on file  Social History Narrative  . Not on file     PHYSICAL EXAM  Vitals:   04/15/18 1520  BP: 134/83  Pulse: 93  Weight: 181 lb 6.4 oz (82.3 kg)  Height: 5\' 8"  (1.727 m)   Body mass index is 27.58 kg/m.  Generalized: Well developed, in no acute distress   Neurological examination   Mentation: Alert oriented to time, place,  Attention span and concentration appropriate.   Follows all commands speech and language  fluent.   Cranial nerve II-XII: Pupils were equal round reactive to light extraocular movements were full, visual field were full on confrontational test. Facial sensation and strength were normal. hearing was intact to finger rubbing bilaterally. Uvula tongue midline. head turning and shoulder shrug were normal and symmetric.Tongue protrusion into cheek strength was normal. Motor: normal bulk and tone, full strength in the BUE, BLE,  Sensory: normal and symmetric to light touch,   Coordination: finger-nose-finger, heel-to-shin bilaterally, some apraxia with the use of his  extremities Reflexes: Symmetric upper and lower plantar responses were flexor bilaterally. Gait and Station: Rising up from seated position without assistance, normal stance,  moderate stride, good arm swing, smooth turning, able to perform tiptoe, and heel walking without difficulty. Tandem gait is steady  DIAGNOSTIC DATA (LABS, IMAGING, TESTING) - I reviewed patient records, labs, notes, testing and imaging myself where available.  Lab Results  Component Value Date   WBC 7.1 09/14/2016   HGB 13.5 09/14/2016   HCT 39.1 (L) 09/14/2016   MCV 100.5 (H) 09/14/2016   PLT 265 09/14/2016      Component Value Date/Time   NA 140 09/14/2016 1611   NA 137 03/13/2013 2100   K 4.2 09/14/2016 1611   K 3.7 03/13/2013 2100   CL 105 09/14/2016 1611   CL 105 03/13/2013 2100   CO2 27 09/14/2016 1611   CO2 31 03/13/2013 2100   GLUCOSE 89 09/14/2016 1611   GLUCOSE 105 (H) 03/13/2013 2100   BUN 9 09/14/2016 1611   BUN 14 03/13/2013 2100   CREATININE 0.93 09/14/2016 1611   CREATININE 1.15 03/13/2013 2100   CALCIUM 9.3 09/14/2016 1611   CALCIUM 9.1 03/13/2013 2100   PROT 7.3 03/13/2013 2100   ALBUMIN 4.0 03/13/2013 2100   AST 35 03/13/2013 2100   ALT 27 03/13/2013 2100   ALKPHOS 68 03/13/2013 2100   BILITOT 0.2 03/13/2013 2100   GFRNONAA >60 09/14/2016 1611   GFRNONAA >60 03/13/2013 2100   GFRAA >60 09/14/2016 1611   GFRAA  >60 03/13/2013 2100    ASSESSMENT AND PLAN  48 y.o. year old male  has a past medical history of Anxiety disorder, Autism, Dyslipidemia, Psychosis ([place]), and Seizures ([place]). here to follow-up for his seizure disorder which is been well controlled last seizure occurred September 14, 2016.  Recent Dilantin level from primary care-year-drawn 04/02/2018 was 8.3   PLAN:  Will increase Dilantin to 400mg  at 7 am along with 50mg  dose Repeat Dilantin level in 2 weeks Follow-up 6 months Dennie Bible, [facility], [facility], APRN  [facility] [address], [place], [place] [address] [phone]

## 2018-04-15 ENCOUNTER — Encounter: Payer: Self-pay | Admitting: Nurse Practitioner

## 2018-04-15 ENCOUNTER — Telehealth: Payer: Self-pay

## 2018-04-15 ENCOUNTER — Ambulatory Visit (INDEPENDENT_AMBULATORY_CARE_PROVIDER_SITE_OTHER): Payer: Medicare Other | Admitting: Nurse Practitioner

## 2018-04-15 VITALS — BP 134/83 | HR 93 | Ht 68.0 in | Wt 181.4 lb

## 2018-04-15 DIAGNOSIS — G40309 Generalized idiopathic epilepsy and epileptic syndromes, not intractable, without status epilepticus: Secondary | ICD-10-CM | POA: Diagnosis not present

## 2018-04-15 DIAGNOSIS — Z5181 Encounter for therapeutic drug level monitoring: Secondary | ICD-10-CM | POA: Diagnosis not present

## 2018-04-15 MED ORDER — DILANTIN INFATABS 50 MG PO CHEW: CHEWABLE_TABLET | ORAL | 6 refills | Status: DC

## 2018-04-15 MED ORDER — DILANTIN 100 MG PO CAPS: 400.0000 mg | ORAL_CAPSULE | Freq: Every day | ORAL | 6 refills | Status: DC

## 2018-04-15 NOTE — Telephone Encounter (Signed)
[facility] [phone] asking for a copy of the patient's recent Dilantin levels. Waiting for them to fax over the results.

## 2018-04-15 NOTE — Patient Instructions (Addendum)
  Dilantin level 8.3  Will increase Dilantin to 400mg  at 7 am along with 50mg  dose Repeat Dilantin level in 2 weeks Follow-up 6 months

## 2018-04-15 NOTE — Progress Notes (Signed)
I have read the note, and I agree with the clinical assessment and plan.  Saylor Murry K Brysen Shankman   

## 2018-05-21 ENCOUNTER — Other Ambulatory Visit: Payer: Self-pay | Admitting: Neurology

## 2018-06-23 ENCOUNTER — Other Ambulatory Visit: Payer: Self-pay | Admitting: Neurology

## 2018-07-31 IMAGING — CT CT HEAD W/O CM
3 series · 15 of 47 positions shown, 18 images · non-contrast
Comparison: January 03, 2005

CLINICAL DATA: Seizure.  History of autism

EXAM:
CT HEAD WITHOUT CONTRAST
TECHNIQUE: Contiguous axial images were obtained from the base of the skull
through the vertex without intravenous contrast.

[Series 2: head wo · axial · 0.45mm/px · z∈[+441,+576]mm · 9 of 33 slices shown, 12 images]
[im 3/33  brain]
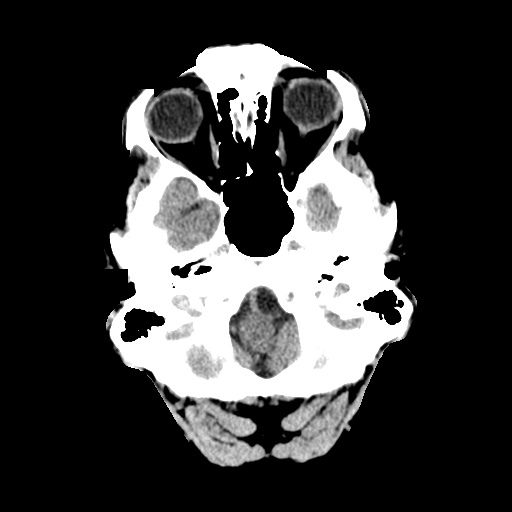
[im 3/33  bone]
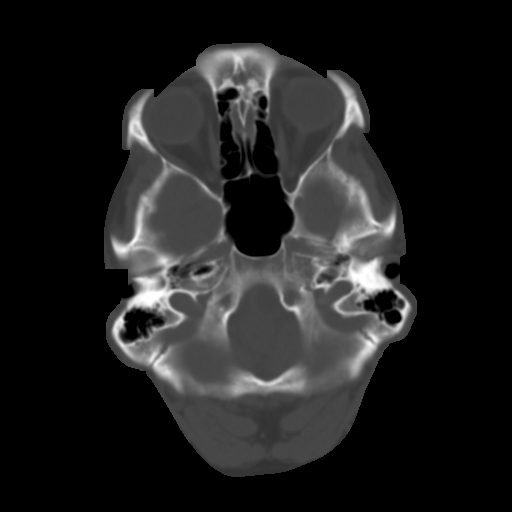
[im 6/33  brain]
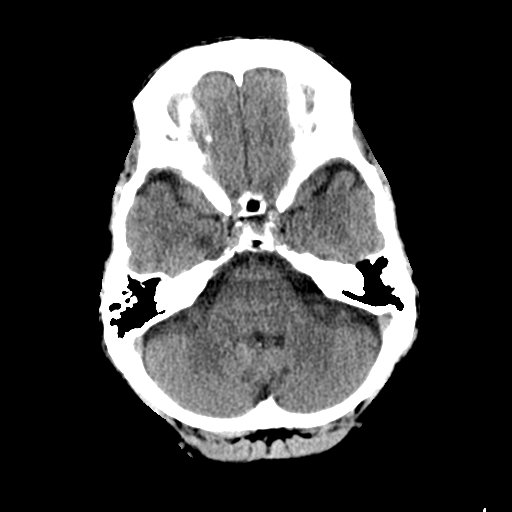
[im 9/33  brain]
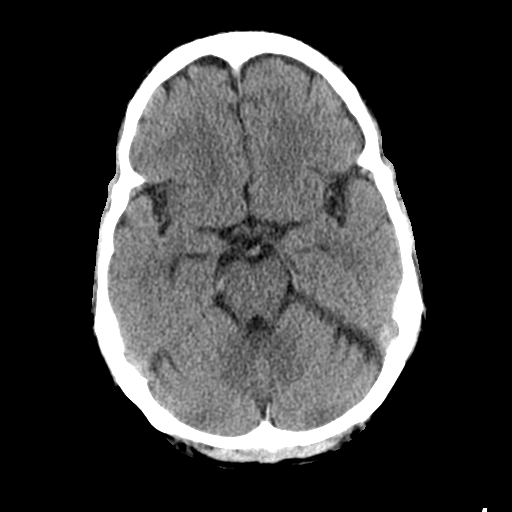
[im 13/33  brain]
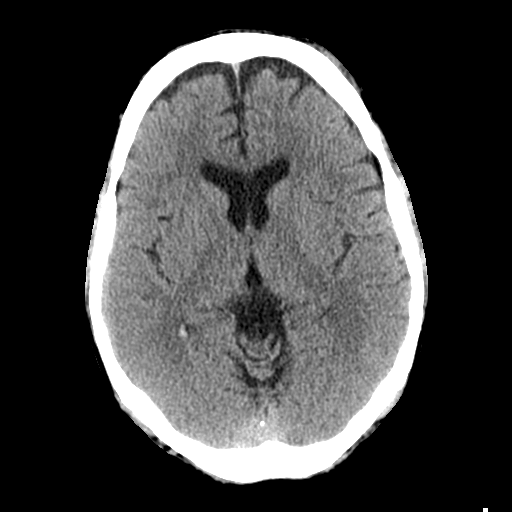
[im 17/33  brain]
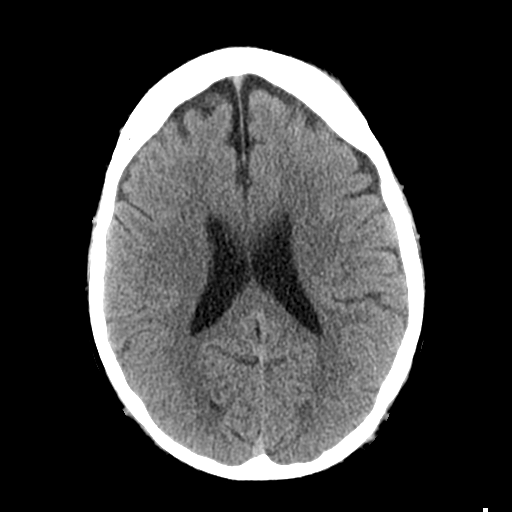
[im 17/33  bone]
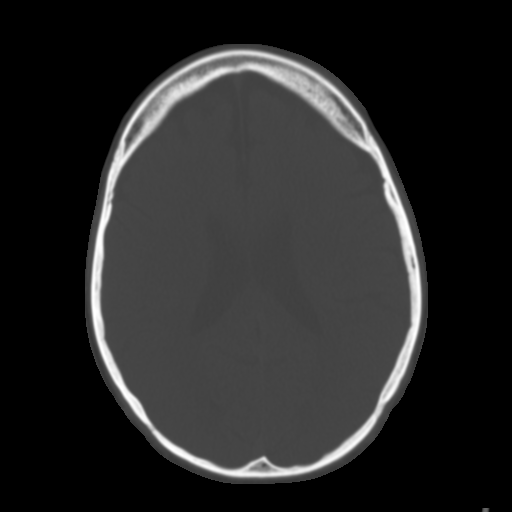
[im 20/33  brain]
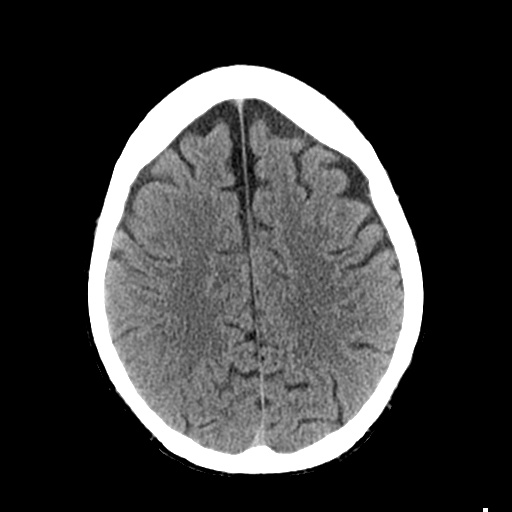
[im 24/33  brain]
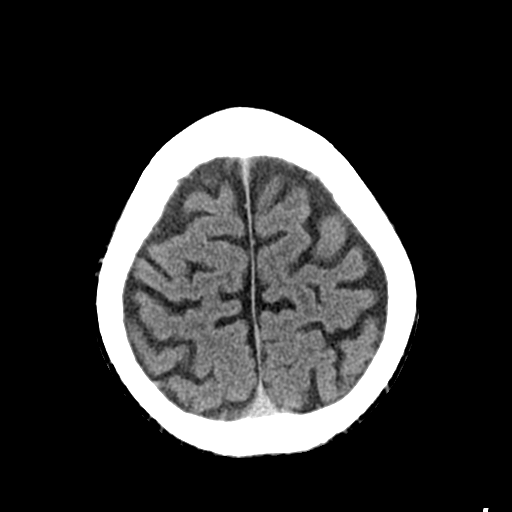
[im 27/33  brain]
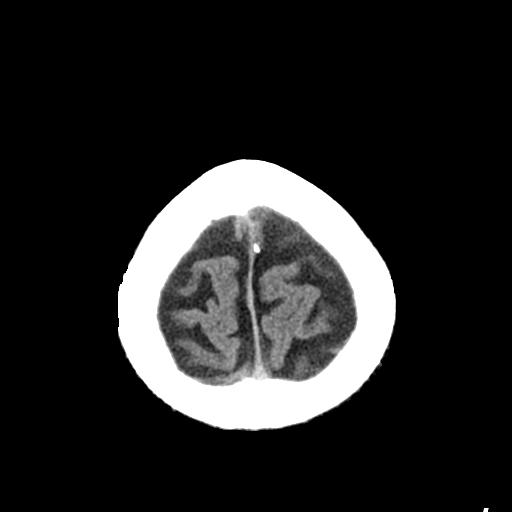
[im 30/33  brain]
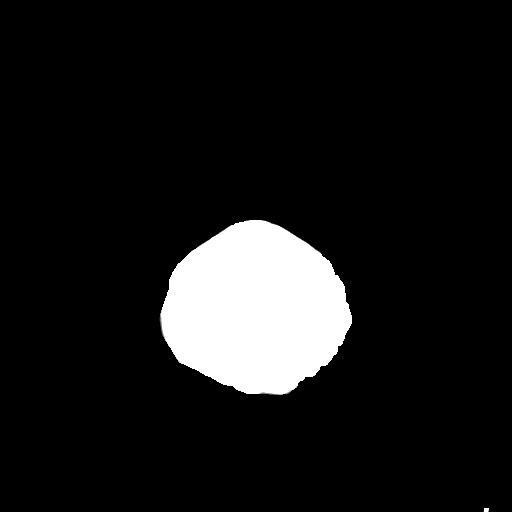
[im 30/33  bone]
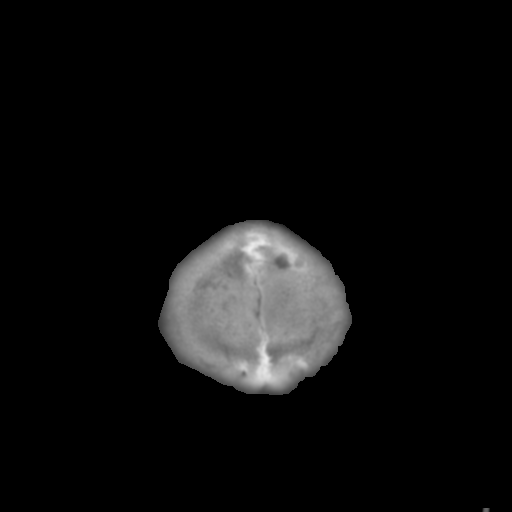

[Series 4: coronal soft tissue · coronal · 0.32mm/px · 3 of 68 slices shown]
[im 23/68  brain]
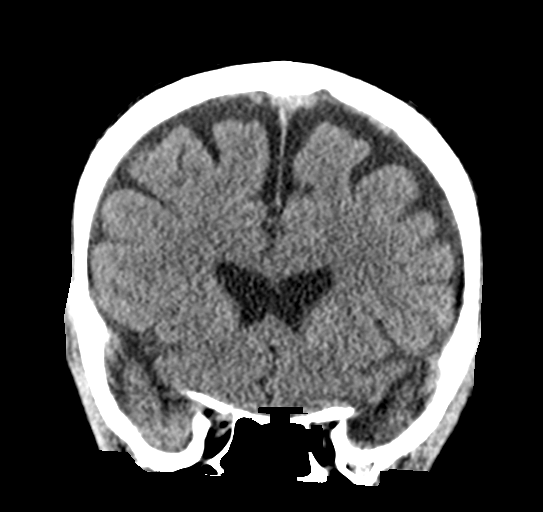
[im 30/68  brain]
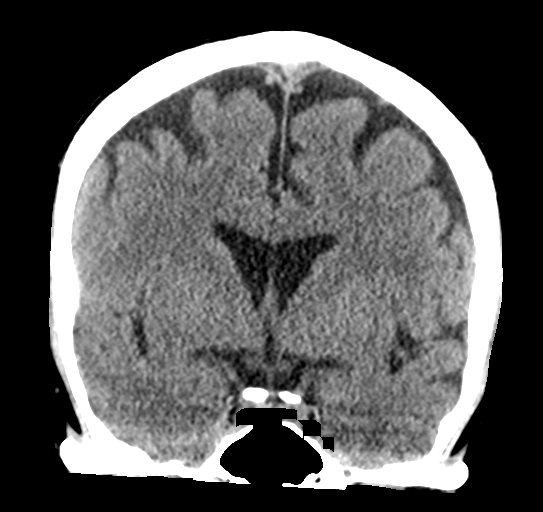
[im 38/68  brain]
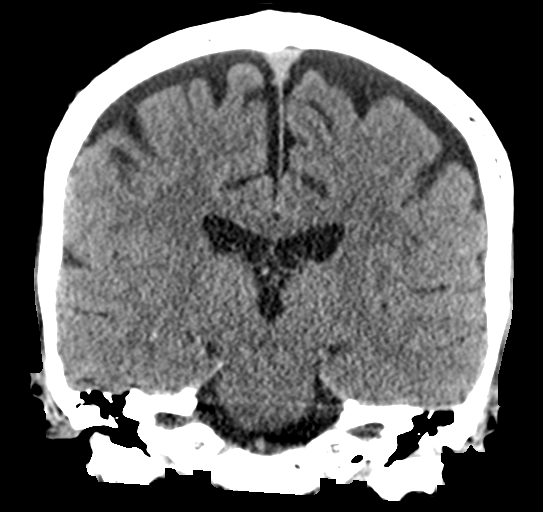

[Series 5: sagittal soft tissue · sagittal · 0.34mm/px · 3 of 53 slices shown]
[im 18/53  brain]
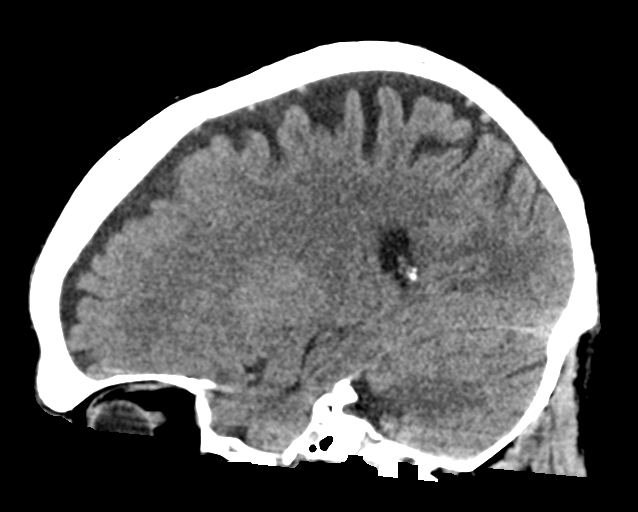
[im 27/53  brain]
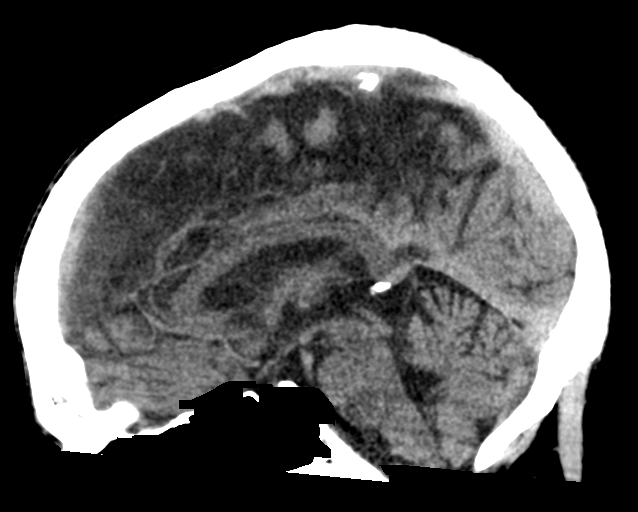
[im 35/53  brain]
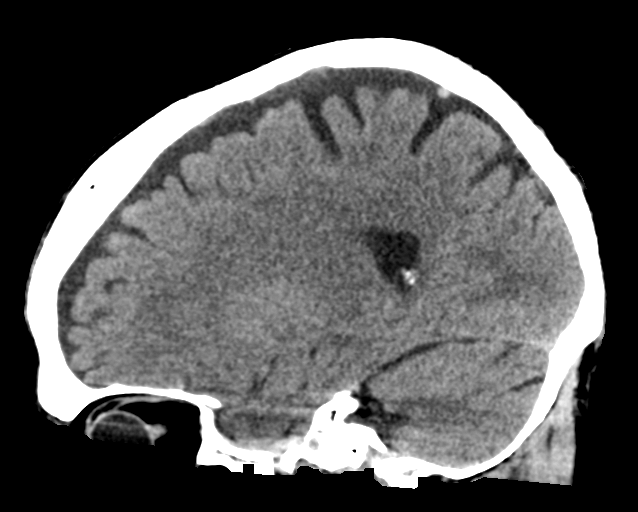

[15 of 47 positions shown; findings below may reference images not displayed]

FINDINGS: Brain: The ventricles are upper limits normal in size for age with
normal contour. There is mild frontal and parietal lobe atrophy
bilaterally for age. There is no intracranial mass, hemorrhage,
extra-axial fluid collection, or midline shift. Gray-white
compartments appear normal. No acute infarct evident.

Vascular: No hyperdense vessel. No vascular calcifications are
evident.

Skull: Bony calvarium appears intact. There is a small left frontal
scalp hematoma.

Sinuses/Orbits: There is mucosal thickening in multiple ethmoid air
cells bilaterally. There is slight mucosal thickening in the
anterior left sphenoid sinus. Frontal sinuses are aplastic. Other
visualized paranasal sinuses are clear. Visualized orbits appear
symmetric bilaterally.

Other: Mastoid air cells are clear.
IMPRESSION: 1.  Mild frontal and parietal lobe atrophy for age.

2. No intracranial mass, hemorrhage, or extra-axial fluid
collection. Gray-white compartments appear normal.

3.  Small left frontal scalp hematoma.  No fracture evident.

4.  There are areas of paranasal sinus disease.

## 2018-08-05 ENCOUNTER — Other Ambulatory Visit: Admission: RE | Admit: 2018-08-05 | Payer: Medicare Other | Source: Ambulatory Visit | Admitting: Internal Medicine

## 2018-08-05 ENCOUNTER — Other Ambulatory Visit
Admission: RE | Admit: 2018-08-05 | Discharge: 2018-08-05 | Disposition: A | Discharge status: Home / Self Care | Payer: Medicare Other | Source: Ambulatory Visit | Attending: Internal Medicine | Admitting: Internal Medicine

## 2018-08-05 DIAGNOSIS — G40309 Generalized idiopathic epilepsy and epileptic syndromes, not intractable, without status epilepticus: Secondary | ICD-10-CM | POA: Diagnosis present

## 2018-08-05 LAB — PHENYTOIN LEVEL, TOTAL: Phenytoin Lvl: 26.1 ug/mL — ABNORMAL HIGH (ref 10.0–20.0)

## 2018-09-10 ENCOUNTER — Other Ambulatory Visit: Payer: Self-pay

## 2018-09-10 ENCOUNTER — Encounter: Payer: Self-pay | Admitting: Urology

## 2018-09-10 ENCOUNTER — Ambulatory Visit (INDEPENDENT_AMBULATORY_CARE_PROVIDER_SITE_OTHER): Payer: Medicare Other | Admitting: Urology

## 2018-09-10 VITALS — BP 116/80 | HR 106 | Ht 69.0 in | Wt 175.0 lb

## 2018-09-10 DIAGNOSIS — R339 Retention of urine, unspecified: Secondary | ICD-10-CM | POA: Diagnosis not present

## 2018-09-10 DIAGNOSIS — N401 Enlarged prostate with lower urinary tract symptoms: Secondary | ICD-10-CM

## 2018-09-10 DIAGNOSIS — N138 Other obstructive and reflux uropathy: Secondary | ICD-10-CM

## 2018-09-10 LAB — URINALYSIS, COMPLETE
Bilirubin, UA: NEGATIVE
Glucose, UA: NEGATIVE
Ketones, UA: NEGATIVE
Leukocytes,UA: NEGATIVE
Nitrite, UA: NEGATIVE
Protein,UA: NEGATIVE
RBC, UA: NEGATIVE
Specific Gravity, UA: 1.015 (ref 1.005–1.030)
Urobilinogen, Ur: 1 mg/dL (ref 0.2–1.0)
pH, UA: 6 (ref 5.0–7.5)

## 2018-09-10 LAB — MICROSCOPIC EXAMINATION
Bacteria, UA: NONE SEEN
Epithelial Cells (non renal): NONE SEEN /hpf (ref 0–10)
RBC, Urine: NONE SEEN /hpf (ref 0–2)
WBC, UA: NONE SEEN /hpf (ref 0–5)

## 2018-09-10 LAB — BLADDER SCAN AMB NON-IMAGING

## 2018-09-10 MED ORDER — TAMSULOSIN HCL 0.4 MG PO CAPS: 0.4000 mg | ORAL_CAPSULE | Freq: Every day | ORAL | 11 refills | Status: DC

## 2018-09-10 NOTE — Progress Notes (Addendum)
   09/10/2018 2:44 PM   [name] 09-29-1970 [id number]  Referring provider: Tracie Harrier, MD [address] [place],  [place] [address]  CC: Weak urinary stream  HPI: I saw [name] in urology clinic today in consultation for weak urinary stream from Dr. Ginette Pitman.  He is a 48 year old male with history of autism, psychosis, and anxiety disorder that resides in a group home.  Most of the history is obtained from a representative from his group home today.  She reports she has had some dribbling and weak stream over the last few months.  Denies any history of gross hematuria, UTIs, dysuria, or urinary retention requiring Foley catheter placement.  There are no aggravating or alleviating factors.  Severity is mild.  On direct questioning to the patient, he specifically denies any lower abdominal pain or difficulty with urination.   PVR in clinic today is normal at 100 cc.  Urinalysis is benign with 0 WBCs, 0 RBCs, no bacteria, and nitrite negative.   PMH: Past Medical History:  Diagnosis Date  . Anxiety disorder   . Autism   . Dyslipidemia   . Psychosis ([place])   . Seizures ([place])    most recent sz 09/14/16    Allergies: No Known Allergies  Family History: No family history on file.  Social History:  reports that he has never smoked. He has never used smokeless tobacco. He reports that he does not drink alcohol or use drugs.  ROS: Please see flowsheet from today's date for complete review of systems.  Physical Exam: BP 116/80 (BP Location: Left Arm, Patient Position: Sitting)   Pulse (!) 106   Ht '5\' 9"'$  (1.753 m)   Wt 175 lb (79.4 kg)   BMI 25.84 kg/m    Cardiovascular: No clubbing, cyanosis, or edema. Respiratory: Normal respiratory effort, no increased work of breathing. GI: Abdomen is soft, nontender, nondistended, no abdominal masses GU: No CVA tenderness, phallus without lesions, widely patent meatus Lymph: No cervical or  inguinal lymphadenopathy. Skin: No rashes, bruises or suspicious lesions.  Laboratory Data: Creatinine 0.8, EGFR greater than 60  Urinalysis today 0 WBCs, 0 RBCs, no bacteria  Pertinent Imaging: None to review  Assessment & Plan:   In summary, the patient is a 48 year old male who resides in a group home secondary to severe autism who presents with very mild urinary symptoms of weak stream consistent with BPH.  He is emptying well and urinalysis is benign.  With his other co-morbidities, he would not be a good candidate for PSA screening per AUA guidelines.  I recommended trial of Flomax 0.4 mg nightly for his mild urinary symptoms.  Trial of Flomax 0.4 mg nightly RTC 6 months with PVR, symptom check, if doing well can follow yearly with PCP  [place], [place] [address] [place], [place] [address] [phone]

## 2018-09-10 NOTE — Patient Instructions (Signed)

## 2018-10-16 ENCOUNTER — Telehealth: Payer: Self-pay | Admitting: Neurology

## 2018-10-16 NOTE — Telephone Encounter (Signed)
I called patient regarding rescheduling his 8/10 appointment due to NP schedule change (VV Mon PM) Requested patient call back to reschedule.

## 2018-10-20 ENCOUNTER — Ambulatory Visit: Payer: Medicare Other | Admitting: Neurology

## 2018-11-10 NOTE — Telephone Encounter (Signed)
Called pt to r/s appt due to provider being out. Pts caregiver requesting refills for DILANTIN 100 MG ER capsule and DILANTIN INFATABS 50 MG tablet sent to [facility]

## 2018-11-11 MED ORDER — DILANTIN 100 MG PO CAPS: ORAL_CAPSULE | ORAL | 1 refills | Status: DC

## 2018-11-11 MED ORDER — DILANTIN INFATABS 50 MG PO CHEW: CHEWABLE_TABLET | ORAL | 1 refills | Status: DC

## 2018-11-11 NOTE — Telephone Encounter (Signed)
Redid again to other [employer] and did go thru.

## 2018-11-11 NOTE — Addendum Note (Signed)
Addended by: Brandon Melnick on: 11/11/2018 10:11 AM   Modules accepted: Orders

## 2018-11-12 ENCOUNTER — Ambulatory Visit: Payer: Medicare Other | Admitting: Neurology

## 2018-12-18 ENCOUNTER — Other Ambulatory Visit
Admission: RE | Admit: 2018-12-18 | Discharge: 2018-12-18 | Disposition: A | Discharge status: Home / Self Care | Payer: Medicare Other | Source: Ambulatory Visit | Attending: Internal Medicine | Admitting: Internal Medicine

## 2018-12-18 DIAGNOSIS — G40909 Epilepsy, unspecified, not intractable, without status epilepticus: Secondary | ICD-10-CM | POA: Insufficient documentation

## 2018-12-18 DIAGNOSIS — J309 Allergic rhinitis, unspecified: Secondary | ICD-10-CM | POA: Diagnosis present

## 2018-12-18 DIAGNOSIS — E782 Mixed hyperlipidemia: Secondary | ICD-10-CM | POA: Diagnosis present

## 2018-12-18 DIAGNOSIS — R339 Retention of urine, unspecified: Secondary | ICD-10-CM | POA: Diagnosis present

## 2018-12-18 DIAGNOSIS — N3943 Post-void dribbling: Secondary | ICD-10-CM | POA: Diagnosis present

## 2018-12-18 LAB — PHENYTOIN LEVEL, TOTAL: Phenytoin Lvl: 21 ug/mL — ABNORMAL HIGH (ref 10.0–20.0)

## 2018-12-22 NOTE — Progress Notes (Signed)
PATIENT: [name] DOB: 03/14/1970  REASON FOR VISIT: follow up HISTORY FROM: patient  HISTORY OF PRESENT ILLNESS: Today 12/23/18  [name] is a 48 year old male with history of autism and seizure disorder.  He was last seen in February 2020 for a dose adjustment of Dilantin, after his Dilantin level was found to be low at 8.3.  Following his office visit, his dose was increased to 450 mg daily, from 350 mg daily.  His last seizure occurred September 14, 2016.  He remains on brand-name Dilantin.  He had a recent Dilantin level checked 12/18/2018 by his primary care doctor that resulted elevated at 21.  This level was not a trough level.  He is here today with his caregiver from the group home, [name].  He has not had any recent problems.  He has been doing well.  He has not had any falls.  The group home manages and administers his medications.  His primary care doctor is [name] at the [facility].  He presents today for follow-up accompanied by caregiver.  HISTORY UPDATE 2/4/2020CM [name], 48 year old male returns for follow-up with a history of autism and seizure disorder.  He has not had any seizure activity since last seen labs drawn by his primary care 04/02/2018 and his Dilantin level was 8.3.  He was sent to the office today for adjustments in his medication.  He continues to live in a group home and is with his caregiver today. He goes to a day program.  Last seizure was September 14, 2016.  He is on brand Dilantin.  He returns for reevaluation  10/17/17 KWMr.Doucetteis a 48 year old right-handed white male with a history of autism and seizures. He has done quite well over the last year without any recurrent seizure events. The patient remains on Dilantin, he also takes vitamin D supplementation. The patient tolerates the medication quite well. He returns for an evaluation.  REVIEW OF SYSTEMS: Out of a complete 14 system review of symptoms, the patient complains only of the  following symptoms, and all other reviewed systems are negative.  Seizures  ALLERGIES: No Known Allergies  HOME MEDICATIONS: Outpatient Medications Prior to Visit  Medication Sig Dispense Refill  . acetaminophen (TYLENOL) 650 MG CR tablet Take 650 mg by mouth every 8 (eight) hours as needed for pain.    Marland Kitchen atorvastatin (LIPITOR) 10 MG tablet Take 10 mg by mouth daily.    . benzonatate (TESSALON) 200 MG capsule Take 200 mg by mouth 3 (three) times daily as needed for cough.    . cetirizine (ZYRTEC) 10 MG tablet Take 10 mg by mouth daily.    . chlorhexidine (PERIDEX) 0.12 % solution     . chlorproMAZINE (THORAZINE) 50 MG tablet Take 50 mg by mouth 3 (three) times daily as needed. Max 3 doses in 24 hrs as needed    . cholecalciferol (VITAMIN D) 1000 UNITS tablet Take 1 tablet (1,000 Units total) by mouth daily. 90 tablet 1  . cloNIDine (CATAPRES - DOSED IN MG/24 HR) 0.2 mg/24hr patch Place 1 patch onto the skin once a week.    . cloNIDine (CATAPRES - DOSED IN MG/24 HR) 0.2 mg/24hr patch APPLY ONE PATCH ONTO THE SKIN ONCE A WEEK. *REMOVE OLD PATCH* *CHECK BP Q WEEK* (HYPERTENSION)    . DILANTIN 100 MG ER capsule TAKE 4 CAPSULES (400MG ) BY MOUTH AT 7AM FOR SEIZURES. *BRAND NAME MEDICALLY REQUIRED* 120 capsule 1  . DILANTIN INFATABS 50 MG tablet 1 tab  every dday along with 400mg  dose Brand medically necessary 30 tablet 1  . fluticasone (FLONASE) 50 MCG/ACT nasal spray Place 1 spray into both nostrils daily.    . hydrocortisone (ANUSOL-HC) 2.5 % rectal cream Place 2.5 application rectally 3 (three) times daily as needed for hemorrhoids or anal itching.     . hydrocortisone 2.5 % cream Apply 2.5 application topically 3 (three) times daily as needed (RASH).     Marland Kitchen ketoconazole (NIZORAL) 2 % shampoo Apply 2 application topically daily.    Marland Kitchen LORazepam (ATIVAN) 1 MG tablet Take 1 mg by mouth daily.    . mometasone (ELOCON) 0.1 % cream Apply 1 application topically daily.    . ondansetron (ZOFRAN-ODT) 4  MG disintegrating tablet TAKE ONE TABLET BY MOUTH EVERY 8 HOURS AS NEEDED FOR NAUSEA    . polyethylene glycol (MIRALAX / GLYCOLAX) packet Mix one packet in 4 ounces of water on Tuesday and Thursday for constipation.    . psyllium (METAMUCIL) 58.6 % powder Take 1 packet by mouth 2 (two) times daily.    . risperiDONE (RISPERDAL) 0.5 MG tablet Take 0.75 mg by mouth 3 (three) times daily. 1.5 tabs po TID    . Sodium Fluoride (PREVIDENT 5000 PLUS DT) Place 1 Dose onto teeth as needed.    . SUMAtriptan (IMITREX) 25 MG tablet Take by mouth.    . tamsulosin (FLOMAX) 0.4 MG CAPS capsule Take 1 capsule (0.4 mg total) by mouth daily. 30 capsule 11  . triamcinolone lotion (KENALOG) 0.1 % Apply 1 application topically daily.     No facility-administered medications prior to visit.     PAST MEDICAL HISTORY: Past Medical History:  Diagnosis Date  . Anxiety disorder   . Autism   . Dyslipidemia   . Psychosis ([place])   . Seizures ([place])    most recent sz 09/14/16    PAST SURGICAL HISTORY: History reviewed. No pertinent surgical history.  FAMILY HISTORY: History reviewed. No pertinent family history.  SOCIAL HISTORY: Social History   Socioeconomic History  . Marital status: Single    Spouse name: Not on file  . Number of children: 0  . Years of education: Not on file  . Highest education level: Not on file  Occupational History  . Not on file  Social Needs  . Financial resource strain: Not on file  . Food insecurity    Worry: Not on file    Inability: Not on file  . Transportation needs    Medical: Not on file    Non-medical: Not on file  Tobacco Use  . Smoking status: Never Smoker  . Smokeless tobacco: Never Used  Substance and Sexual Activity  . Alcohol use: No  . Drug use: No  . Sexual activity: Not on file  Lifestyle  . Physical activity    Days per week: Not on file    Minutes per session: Not on file  . Stress: Not on file  Relationships  . Social Herbalist on  phone: Not on file    Gets together: Not on file    Attends religious service: Not on file    Active member of club or organization: Not on file    Attends meetings of clubs or organizations: Not on file    Relationship status: Not on file  . Intimate partner violence    Fear of current or ex partner: Not on file    Emotionally abused: Not on file    Physically abused:  Not on file    Forced sexual activity: Not on file  Other Topics Concern  . Not on file  Social History Narrative  . Not on file    PHYSICAL EXAM  Vitals:   12/23/18 1344  BP: 114/71  Pulse: 90  Temp: 98 F (36.7 C)  TempSrc: Oral  Weight: 180 lb 9.6 oz (81.9 kg)  Height: 5\' 9"  (1.753 m)   Body mass index is 26.67 kg/m.  Generalized: Well developed, in no acute distress   Neurological examination  Mentation: Alert, history is provided by caregiver. Follows all commands speech and language fluent, speech is somewhat abrupt, able to perform exam commands Cranial nerve II-XII: Pupils were equal round reactive to light. Extraocular movements were full, visual field were full on confrontational test. Facial sensation and strength were normal.  Head turning and shoulder shrug  were normal and symmetric. Motor: The motor testing reveals 5 over 5 strength of all 4 extremities. Good symmetric motor tone is noted throughout.  Sensory: Sensory testing is intact to soft touch on all 4 extremities. No evidence of extinction is noted.  Coordination: Cerebellar testing reveals good finger-nose-finger and heel-to-shin bilaterally.  Gait and station: Swift pace, able to rise from seated position without pushoff, fairly wide-based, good turns.  Tandem gait was steady. Reflexes: Deep tendon reflexes are symmetric and normal bilaterally.   DIAGNOSTIC DATA (LABS, IMAGING, TESTING) - I reviewed patient records, labs, notes, testing and imaging myself where available.  Lab Results  Component Value Date   WBC 7.1 09/14/2016    HGB 13.5 09/14/2016   HCT 39.1 (L) 09/14/2016   MCV 100.5 (H) 09/14/2016   PLT 265 09/14/2016      Component Value Date/Time   NA 140 09/14/2016 1611   NA 137 03/13/2013 2100   K 4.2 09/14/2016 1611   K 3.7 03/13/2013 2100   CL 105 09/14/2016 1611   CL 105 03/13/2013 2100   CO2 27 09/14/2016 1611   CO2 31 03/13/2013 2100   GLUCOSE 89 09/14/2016 1611   GLUCOSE 105 (H) 03/13/2013 2100   BUN 9 09/14/2016 1611   BUN 14 03/13/2013 2100   CREATININE 0.93 09/14/2016 1611   CREATININE 1.15 03/13/2013 2100   CALCIUM 9.3 09/14/2016 1611   CALCIUM 9.1 03/13/2013 2100   PROT 7.3 03/13/2013 2100   ALBUMIN 4.0 03/13/2013 2100   AST 35 03/13/2013 2100   ALT 27 03/13/2013 2100   ALKPHOS 68 03/13/2013 2100   BILITOT 0.2 03/13/2013 2100   GFRNONAA >60 09/14/2016 1611   GFRNONAA >60 03/13/2013 2100   GFRAA >60 09/14/2016 1611   GFRAA >60 03/13/2013 2100   No results found for: CHOL, HDL, LDLCALC, LDLDIRECT, TRIG, CHOLHDL No results found for: HGBA1C No results found for: VITAMINB12 No results found for: TSH  ASSESSMENT AND PLAN 48 y.o. year old male  has a past medical history of Anxiety disorder, Autism, Dyslipidemia, Psychosis ([place]), and Seizures ([place]). here with:  1.  Seizures 2.  Autism  He remains on Dilantin 450 mg daily.  At his last visit in February, his dose was increased from 350 mg daily to 450 mg daily due to a low dilantin level of 8.3 drawn by his primary doctor.  He had a Dilantin level checked 12/18/2018 that was 21.0, but was not a trough level. There are no signs of toxicity on exam. He is scheduled to see his primary doctor, [name].  I have written on his group home order form for  him to have a trough level collected. His caregiver wanted to arrange to be done through his primary doctor due to logistics.  He has not had recurrent seizure.  Otherwise, he will return in 1 year or sooner if needed.  I did advise if symptoms worsen or if he develops any new symptoms he  should let us know. I did not refill his medications, was waiting to see the repeat Dilantin level.   I spent 15 minutes with the patient. 50% of this time was spent discussing his plan of care.  Butler Denmark, AGNP-C, DNP 12/23/2018, 1:54 PM [facility] [address] [place], [place] [address] [phone]

## 2018-12-23 ENCOUNTER — Other Ambulatory Visit: Payer: Self-pay

## 2018-12-23 ENCOUNTER — Encounter: Payer: Self-pay | Admitting: Neurology

## 2018-12-23 ENCOUNTER — Ambulatory Visit (INDEPENDENT_AMBULATORY_CARE_PROVIDER_SITE_OTHER): Payer: Medicare Other | Admitting: Neurology

## 2018-12-23 VITALS — BP 114/71 | HR 90 | Temp 98.0°F | Ht 69.0 in | Wt 180.6 lb

## 2018-12-23 DIAGNOSIS — G40309 Generalized idiopathic epilepsy and epileptic syndromes, not intractable, without status epilepticus: Secondary | ICD-10-CM | POA: Diagnosis not present

## 2018-12-23 NOTE — Progress Notes (Signed)
I have read the note, and I agree with the clinical assessment and plan.  Charles K [name]   

## 2018-12-23 NOTE — Patient Instructions (Addendum)
1. Have Dr. Ginette Pitman check a trough level of Dilantin to ensure not too high 2 Once level returns we can modify dosing if needed 3. Return here in 1 year or sooner if needed

## 2019-01-21 ENCOUNTER — Other Ambulatory Visit: Payer: Self-pay

## 2019-01-21 MED ORDER — DILANTIN INFATABS 50 MG PO CHEW: CHEWABLE_TABLET | ORAL | 1 refills | Status: DC

## 2019-01-21 NOTE — Addendum Note (Signed)
Addended by: Verlin Grills T on: 01/21/2019 04:22 PM   Modules accepted: Orders

## 2019-03-16 ENCOUNTER — Ambulatory Visit: Payer: Medicare Other | Admitting: Urology

## 2019-04-08 ENCOUNTER — Encounter: Payer: Self-pay | Admitting: Urology

## 2019-04-08 ENCOUNTER — Other Ambulatory Visit: Payer: Self-pay

## 2019-04-08 ENCOUNTER — Ambulatory Visit (INDEPENDENT_AMBULATORY_CARE_PROVIDER_SITE_OTHER): Payer: Medicare Other | Admitting: Urology

## 2019-04-08 VITALS — BP 154/78 | HR 92 | Ht 69.0 in | Wt 186.6 lb

## 2019-04-08 DIAGNOSIS — R569 Unspecified convulsions: Secondary | ICD-10-CM | POA: Insufficient documentation

## 2019-04-08 DIAGNOSIS — N138 Other obstructive and reflux uropathy: Secondary | ICD-10-CM | POA: Diagnosis not present

## 2019-04-08 DIAGNOSIS — F840 Autistic disorder: Secondary | ICD-10-CM | POA: Insufficient documentation

## 2019-04-08 DIAGNOSIS — N401 Enlarged prostate with lower urinary tract symptoms: Secondary | ICD-10-CM

## 2019-04-08 DIAGNOSIS — I10 Essential (primary) hypertension: Secondary | ICD-10-CM | POA: Insufficient documentation

## 2019-04-08 DIAGNOSIS — F819 Developmental disorder of scholastic skills, unspecified: Secondary | ICD-10-CM | POA: Insufficient documentation

## 2019-04-08 LAB — BLADDER SCAN AMB NON-IMAGING

## 2019-04-08 NOTE — Patient Instructions (Addendum)
Discontinue Flomax per Dr. Diamantina Providence 04/08/2019   Benign Prostatic Hyperplasia  Benign prostatic hyperplasia (BPH) is an enlarged prostate gland that is caused by the normal aging process and not by cancer. The prostate is a walnut-sized gland that is involved in the production of semen. It is located in front of the rectum and below the bladder. The bladder stores urine and the urethra is the tube that carries the urine out of the body. The prostate may get bigger as a man gets older. An enlarged prostate can press on the urethra. This can make it harder to pass urine. The build-up of urine in the bladder can cause infection. Back pressure and infection may progress to bladder damage and kidney (renal) failure. What are the causes? This condition is part of a normal aging process. However, not all men develop problems from this condition. If the prostate enlarges away from the urethra, urine flow will not be blocked. If it enlarges toward the urethra and compresses it, there will be problems passing urine. What increases the risk? This condition is more likely to develop in men over the age of 56 years. What are the signs or symptoms? Symptoms of this condition include:  Getting up often during the night to urinate.  Needing to urinate frequently during the day.  Difficulty starting urine flow.  Decrease in size and strength of your urine stream.  Leaking (dribbling) after urinating.  Inability to pass urine. This needs immediate treatment.  Inability to completely empty your bladder.  Pain when you pass urine. This is more common if there is also an infection.  Urinary tract infection (UTI). How is this diagnosed? This condition is diagnosed based on your medical history, a physical exam, and your symptoms. Tests will also be done, such as:  A post-void bladder scan. This measures any amount of urine that may remain in your bladder after you finish urinating.  A digital rectal  exam. In a rectal exam, your health care provider checks your prostate by putting a lubricated, gloved finger into your rectum to feel the back of your prostate gland. This exam detects the size of your gland and any abnormal lumps or growths.  An exam of your urine (urinalysis).  A prostate specific antigen (PSA) screening. This is a blood test used to screen for prostate cancer.  An ultrasound. This test uses sound waves to electronically produce a picture of your prostate gland. Your health care provider may refer you to a specialist in kidney and prostate diseases (urologist). How is this treated? Once symptoms begin, your health care provider will monitor your condition (active surveillance or watchful waiting). Treatment for this condition will depend on the severity of your condition. Treatment may include:  Observation and yearly exams. This may be the only treatment needed if your condition and symptoms are mild.  Medicines to relieve your symptoms, including: ? Medicines to shrink the prostate. ? Medicines to relax the muscle of the prostate.  Surgery in severe cases. Surgery may include: ? Prostatectomy. In this procedure, the prostate tissue is removed completely through an open incision or with a laparoscope or robotics. ? Transurethral resection of the prostate (TURP). In this procedure, a tool is inserted through the opening at the tip of the penis (urethra). It is used to cut away tissue of the inner core of the prostate. The pieces are removed through the same opening of the penis. This removes the blockage. ? Transurethral incision (TUIP). In this procedure, small  cuts are made in the prostate. This lessens the prostate's pressure on the urethra. ? Transurethral microwave thermotherapy (TUMT). This procedure uses microwaves to create heat. The heat destroys and removes a small amount of prostate tissue. ? Transurethral needle ablation (TUNA). This procedure uses radio  frequencies to destroy and remove a small amount of prostate tissue. ? Interstitial laser coagulation ([place]). This procedure uses a laser to destroy and remove a small amount of prostate tissue. ? Transurethral electrovaporization (TUVP). This procedure uses electrodes to destroy and remove a small amount of prostate tissue. ? Prostatic urethral lift. This procedure inserts an implant to push the lobes of the prostate away from the urethra. Follow these instructions at home:  Take over-the-counter and prescription medicines only as told by your health care provider.  Monitor your symptoms for any changes. Contact your health care provider with any changes.  Avoid drinking large amounts of liquid before going to bed or out in public.  Avoid or reduce how much caffeine or alcohol you drink.  Give yourself time when you urinate.  Keep all follow-up visits as told by your health care provider. This is important. Contact a health care provider if:  You have unexplained back pain.  Your symptoms do not get better with treatment.  You develop side effects from the medicine you are taking.  Your urine becomes very dark or has a bad smell.  Your lower abdomen becomes distended and you have trouble passing your urine. Get help right away if:  You have a fever or chills.  You suddenly cannot urinate.  You feel lightheaded, or very dizzy, or you faint.  There are large amounts of blood or clots in the urine.  Your urinary problems become hard to manage.  You develop moderate to severe low back or flank pain. The flank is the side of your body between the ribs and the hip. These symptoms may represent a serious problem that is an emergency. Do not wait to see if the symptoms will go away. Get medical help right away. Call your local emergency services (911 in the [place] Do not drive yourself to the hospital. Summary  Benign prostatic hyperplasia (BPH) is an enlarged prostate that is  caused by the normal aging process and not by cancer.  An enlarged prostate can press on the urethra. This can make it hard to pass urine.  This condition is part of a normal aging process and is more likely to develop in men over the age of 46 years.  Get help right away if you suddenly cannot urinate. This information is not intended to replace advice given to you by your health care provider. Make sure you discuss any questions you have with your health care provider. Document Revised: 01/21/2018 Document Reviewed: 04/02/2016 Elsevier Patient Education  2020 [employer].

## 2019-04-08 NOTE — Progress Notes (Signed)
   04/08/2019 10:27 AM   [name] Nov 24, 1970 [id number]  Reason for visit: Follow up urinary symptoms  HPI: I saw [name] back in urology clinic today for follow-up of urinary symptoms.  He is a 49 year old male with autism and anxiety disorder that lives in a group home.  At our last visit, his group home representative was concerned about some weak stream and intermittent stream, and we started Flomax.  At his original visit, PVR was normal at 100 cc and urinalysis was benign.  The patient continues to deny any complaints regarding his urination including hematuria, weak stream, burning, or intermittency.  Per the representative, she notes that he still occasionally will have a stream that stops and starts.  PVRs again normal in clinic today at 98 mL.  The patient reports no difference in his urination on Flomax over the last 6 months.  I think his urinary behaviors are more likely related to self starting and stopping his urination and not true obstruction or blockage.  His PVR has always been reassuring, and urinalysis was benign, additionally he has no complaints regarding his urination and this is all been brought up by the group home representative.  I recommended discontinuing Flomax and following up as needed.  Return precautions would include gross hematuria, infection, or urinary retention.  Follow-up as needed Discontinue Flomax  A total of 20 minutes were spent face-to-face with the patient, greater than 50% was spent in patient education, counseling, and coordination of care regarding urinary symptoms and behaviors.  Billey Co, [facility] [address], [place], [place] [address] [phone]

## 2019-04-23 ENCOUNTER — Other Ambulatory Visit
Admission: RE | Admit: 2019-04-23 | Discharge: 2019-04-23 | Disposition: A | Discharge status: Home / Self Care | Payer: Medicare Other | Source: Ambulatory Visit | Attending: Internal Medicine | Admitting: Internal Medicine

## 2019-04-23 DIAGNOSIS — G43009 Migraine without aura, not intractable, without status migrainosus: Secondary | ICD-10-CM | POA: Insufficient documentation

## 2019-04-23 DIAGNOSIS — J309 Allergic rhinitis, unspecified: Secondary | ICD-10-CM | POA: Diagnosis present

## 2019-04-23 DIAGNOSIS — F329 Major depressive disorder, single episode, unspecified: Secondary | ICD-10-CM | POA: Diagnosis present

## 2019-04-23 DIAGNOSIS — G40909 Epilepsy, unspecified, not intractable, without status epilepticus: Secondary | ICD-10-CM | POA: Diagnosis present

## 2019-04-23 DIAGNOSIS — F419 Anxiety disorder, unspecified: Secondary | ICD-10-CM | POA: Insufficient documentation

## 2019-04-23 DIAGNOSIS — R7889 Finding of other specified substances, not normally found in blood: Secondary | ICD-10-CM | POA: Diagnosis present

## 2019-04-23 DIAGNOSIS — E781 Pure hyperglyceridemia: Secondary | ICD-10-CM | POA: Insufficient documentation

## 2019-04-23 LAB — PHENYTOIN LEVEL, TOTAL: Phenytoin Lvl: 15.7 ug/mL (ref 10.0–20.0)

## 2019-04-29 ENCOUNTER — Other Ambulatory Visit: Payer: Self-pay

## 2019-04-29 MED ORDER — DILANTIN 100 MG PO CAPS: ORAL_CAPSULE | ORAL | 12 refills | Status: DC

## 2019-05-25 ENCOUNTER — Other Ambulatory Visit: Payer: Self-pay | Admitting: *Deleted

## 2019-12-23 ENCOUNTER — Ambulatory Visit: Payer: Medicare Other | Admitting: Neurology

## 2019-12-23 NOTE — Progress Notes (Deleted)
PATIENT: [name] DOB: 1970/05/19  REASON FOR VISIT: follow up HISTORY FROM: patient  HISTORY OF PRESENT ILLNESS: Today 12/23/19 [name] is a 49 year old male with history of autism and seizure disorder.  Last seizure in July 2018.  He is on brand-name Dilantin.  He lives at a group home.  In February 2020 his Dilantin level was low at 8.3, he was increased to 450 mg daily from 350 mg daily. HISTORY 12/23/2018 SS: [name] is a 49 year old male with history of autism and seizure disorder.  He was last seen in February 2020 for a dose adjustment of Dilantin, after his Dilantin level was found to be low at 8.3.  Following his office visit, his dose was increased to 450 mg daily, from 350 mg daily.  His last seizure occurred September 14, 2016.  He remains on brand-name Dilantin.  He had a recent Dilantin level checked 12/18/2018 by his primary care doctor that resulted elevated at 21.  This level was not a trough level.  He is here today with his caregiver from the group home, Jocelyn Lamer.  He has not had any recent problems.  He has been doing well.  He has not had any falls.  The group home manages and administers his medications.  His primary care doctor is Dr. Ginette Pitman at the [facility].  He presents today for follow-up accompanied by caregiver.   REVIEW OF SYSTEMS: Out of a complete 14 system review of symptoms, the patient complains only of the following symptoms, and all other reviewed systems are negative.  ALLERGIES: No Known Allergies  HOME MEDICATIONS: Outpatient Medications Prior to Visit  Medication Sig Dispense Refill  . acetaminophen (TYLENOL) 650 MG CR tablet Take 650 mg by mouth every 8 (eight) hours as needed for pain.    Marland Kitchen atorvastatin (LIPITOR) 20 MG tablet Take by mouth.    . benzonatate (TESSALON) 200 MG capsule Take 200 mg by mouth 3 (three) times daily as needed for cough.    . cetirizine (ZYRTEC) 10 MG tablet Take by mouth.    . chlorhexidine (PERIDEX) 0.12  % solution     . chlorproMAZINE (THORAZINE) 50 MG tablet Take 50 mg by mouth 3 (three) times daily as needed. Max 3 doses in 24 hrs as needed    . Cholecalciferol 25 MCG (1000 UT) tablet Take by mouth.    . cloNIDine (CATAPRES - DOSED IN MG/24 HR) 0.2 mg/24hr patch Place 1 patch onto the skin once a week.    . cloNIDine (CATAPRES - DOSED IN MG/24 HR) 0.2 mg/24hr patch APPLY ONE PATCH ONTO THE SKIN ONCE A WEEK. *REMOVE OLD PATCH* *CHECK BP Q WEEK* (HYPERTENSION)    . DILANTIN 100 MG ER capsule TAKE 4 CAPSULES (400MG ) BY MOUTH AT 7AM FOR SEIZURES. *BRAND NAME MEDICALLY REQUIRED* 120 capsule 12  . DILANTIN INFATABS 50 MG tablet 1 tab every dday along with 400mg  dose Brand medically necessary 30 tablet 1  . fluticasone (FLONASE) 50 MCG/ACT nasal spray Place into the nose.    . hydrocortisone (ANUSOL-HC) 2.5 % rectal cream Place 2.5 application rectally 3 (three) times daily as needed for hemorrhoids or anal itching.     . hydrocortisone 2.5 % cream Place rectally.    Marland Kitchen ketoconazole (NIZORAL) 2 % shampoo Apply topically.    Marland Kitchen LORazepam (ATIVAN) 1 MG tablet Take 1 mg by mouth daily.    Marland Kitchen LORazepam (ATIVAN) 1 MG tablet Take 0.5 mg by mouth daily at 2 PM.    .  mometasone (ELOCON) 0.1 % cream Apply 1 application topically daily.    . Omega-3 1000 MG CAPS Take by mouth.    . ondansetron (ZOFRAN-ODT) 4 MG disintegrating tablet TAKE ONE TABLET BY MOUTH EVERY 8 HOURS AS NEEDED FOR NAUSEA    . polyethylene glycol (MIRALAX / GLYCOLAX) packet Mix one packet in 4 ounces of water on Tuesday and Thursday for constipation.    . psyllium (METAMUCIL SMOOTH TEXTURE) 58.6 % powder MIX 1 PACKET AS DIRECTED & TAKE BY MOUTH TWICE DAILY    . risperiDONE (RISPERDAL) 0.25 MG tablet     . risperiDONE (RISPERDAL) 0.5 MG tablet Take 0.75 mg by mouth 3 (three) times daily. 1.5 tabs po TID    . Sodium Fluoride (PREVIDENT 5000 PLUS DT) Place 1 Dose onto teeth as needed.    . SUMAtriptan (IMITREX) 25 MG tablet Take by mouth.    .  triamcinolone lotion (KENALOG) 0.1 % Apply 1 application topically daily.     No facility-administered medications prior to visit.    PAST MEDICAL HISTORY: Past Medical History:  Diagnosis Date  . Anxiety disorder   . Autism   . BPH (benign prostatic hyperplasia)   . Dyslipidemia   . Psychosis ([place])   . Seizures ([place])    most recent sz 09/14/16    PAST SURGICAL HISTORY: No past surgical history on file.  FAMILY HISTORY: No family history on file.  SOCIAL HISTORY: Social History   Socioeconomic History  . Marital status: Single    Spouse name: Not on file  . Number of children: 0  . Years of education: Not on file  . Highest education level: Not on file  Occupational History  . Not on file  Tobacco Use  . Smoking status: Never Smoker  . Smokeless tobacco: Never Used  Substance and Sexual Activity  . Alcohol use: No  . Drug use: No  . Sexual activity: Not Currently  Other Topics Concern  . Not on file  Social History Narrative  . Not on file   Social Determinants of Health   Financial Resource Strain:   . Difficulty of Paying Living Expenses: Not on file  Food Insecurity:   . Worried About Charity fundraiser in the Last Year: Not on file  . Ran Out of Food in the Last Year: Not on file  Transportation Needs:   . Lack of Transportation (Medical): Not on file  . Lack of Transportation (Non-Medical): Not on file  Physical Activity:   . Days of Exercise per Week: Not on file  . Minutes of Exercise per Session: Not on file  Stress:   . Feeling of Stress : Not on file  Social Connections:   . Frequency of Communication with Friends and Family: Not on file  . Frequency of Social Gatherings with Friends and Family: Not on file  . Attends Religious Services: Not on file  . Active Member of Clubs or Organizations: Not on file  . Attends Archivist Meetings: Not on file  . Marital Status: Not on file  Intimate Partner Violence:   . Fear of Current or  Ex-Partner: Not on file  . Emotionally Abused: Not on file  . Physically Abused: Not on file  . Sexually Abused: Not on file      PHYSICAL EXAM  There were no vitals filed for this visit. There is no height or weight on file to calculate BMI.  Generalized: Well developed, in no acute distress  Neurological examination  Mentation: Alert oriented to time, place, history taking. Follows all commands speech and language fluent Cranial nerve II-XII: Pupils were equal round reactive to light. Extraocular movements were full, visual field were full on confrontational test. Facial sensation and strength were normal. Uvula tongue midline. Head turning and shoulder shrug  were normal and symmetric. Motor: The motor testing reveals 5 over 5 strength of all 4 extremities. Good symmetric motor tone is noted throughout.  Sensory: Sensory testing is intact to soft touch on all 4 extremities. No evidence of extinction is noted.  Coordination: Cerebellar testing reveals good finger-nose-finger and heel-to-shin bilaterally.  Gait and station: Gait is normal. Tandem gait is normal. Romberg is negative. No drift is seen.  Reflexes: Deep tendon reflexes are symmetric and normal bilaterally.   DIAGNOSTIC DATA (LABS, IMAGING, TESTING) - I reviewed patient records, labs, notes, testing and imaging myself where available.  Lab Results  Component Value Date   WBC 7.1 09/14/2016   HGB 13.5 09/14/2016   HCT 39.1 (L) 09/14/2016   MCV 100.5 (H) 09/14/2016   PLT 265 09/14/2016      Component Value Date/Time   NA 140 09/14/2016 1611   NA 137 03/13/2013 2100   K 4.2 09/14/2016 1611   K 3.7 03/13/2013 2100   CL 105 09/14/2016 1611   CL 105 03/13/2013 2100   CO2 27 09/14/2016 1611   CO2 31 03/13/2013 2100   GLUCOSE 89 09/14/2016 1611   GLUCOSE 105 (H) 03/13/2013 2100   BUN 9 09/14/2016 1611   BUN 14 03/13/2013 2100   CREATININE 0.93 09/14/2016 1611   CREATININE 1.15 03/13/2013 2100   CALCIUM 9.3  09/14/2016 1611   CALCIUM 9.1 03/13/2013 2100   PROT 7.3 03/13/2013 2100   ALBUMIN 4.0 03/13/2013 2100   AST 35 03/13/2013 2100   ALT 27 03/13/2013 2100   ALKPHOS 68 03/13/2013 2100   BILITOT 0.2 03/13/2013 2100   GFRNONAA >60 09/14/2016 1611   GFRNONAA >60 03/13/2013 2100   GFRAA >60 09/14/2016 1611   GFRAA >60 03/13/2013 2100   No results found for: CHOL, HDL, LDLCALC, LDLDIRECT, TRIG, CHOLHDL No results found for: HGBA1C No results found for: VITAMINB12 No results found for: TSH    ASSESSMENT AND PLAN 49 y.o. year old male  has a past medical history of Anxiety disorder, Autism, BPH (benign prostatic hyperplasia), Dyslipidemia, Psychosis ([place]), and Seizures ([place]). here with ***   I spent 15 minutes with the patient. 50% of this time was spent   Butler Denmark, AGNP-C, DNP 12/23/2019, 6:00 AM [facility] [address] [place], [place] [address] [phone]

## 2020-01-12 ENCOUNTER — Other Ambulatory Visit
Admission: RE | Admit: 2020-01-12 | Discharge: 2020-01-12 | Disposition: A | Discharge status: Home / Self Care | Payer: Medicare Other | Source: Ambulatory Visit | Attending: Internal Medicine | Admitting: Internal Medicine

## 2020-01-12 DIAGNOSIS — R945 Abnormal results of liver function studies: Secondary | ICD-10-CM | POA: Insufficient documentation

## 2020-01-12 DIAGNOSIS — Z0000 Encounter for general adult medical examination without abnormal findings: Secondary | ICD-10-CM | POA: Insufficient documentation

## 2020-01-12 DIAGNOSIS — F419 Anxiety disorder, unspecified: Secondary | ICD-10-CM | POA: Insufficient documentation

## 2020-01-12 DIAGNOSIS — E782 Mixed hyperlipidemia: Secondary | ICD-10-CM | POA: Diagnosis present

## 2020-01-12 DIAGNOSIS — G40909 Epilepsy, unspecified, not intractable, without status epilepticus: Secondary | ICD-10-CM | POA: Insufficient documentation

## 2020-01-12 DIAGNOSIS — J309 Allergic rhinitis, unspecified: Secondary | ICD-10-CM | POA: Insufficient documentation

## 2020-01-12 LAB — PHENYTOIN LEVEL, TOTAL: Phenytoin Lvl: 13.5 ug/mL (ref 10.0–20.0)

## 2020-02-03 ENCOUNTER — Other Ambulatory Visit: Payer: Self-pay

## 2020-02-03 ENCOUNTER — Ambulatory Visit (INDEPENDENT_AMBULATORY_CARE_PROVIDER_SITE_OTHER): Payer: Medicare Other | Admitting: Neurology

## 2020-02-03 ENCOUNTER — Encounter: Payer: Self-pay | Admitting: Neurology

## 2020-02-03 VITALS — BP 118/72 | HR 94 | Ht 69.0 in | Wt 188.8 lb

## 2020-02-03 DIAGNOSIS — G40309 Generalized idiopathic epilepsy and epileptic syndromes, not intractable, without status epilepticus: Secondary | ICD-10-CM | POA: Diagnosis not present

## 2020-02-03 DIAGNOSIS — F840 Autistic disorder: Secondary | ICD-10-CM

## 2020-02-03 MED ORDER — DILANTIN INFATABS 50 MG PO CHEW: CHEWABLE_TABLET | ORAL | 11 refills | Status: DC

## 2020-02-03 MED ORDER — DILANTIN 100 MG PO CAPS: ORAL_CAPSULE | ORAL | 12 refills | Status: DC

## 2020-02-03 NOTE — Progress Notes (Signed)
I have read the note, and I agree with the clinical assessment and plan.  [name] K [name]   

## 2020-02-03 NOTE — Progress Notes (Signed)
PATIENT: [name] DOB: 1970-10-13  REASON FOR VISIT: follow up HISTORY FROM: patient  HISTORY OF PRESENT ILLNESS: Today 02/03/20 [name] is a 49 year old male with history of autism and seizure disorder.  Seizures remain well controlled, last was in July 2018.  He is on brand-name Dilantin, tolerating well.  He lives in a group home.  In February 2020, his Dilantin level was low at 8.3, dose was increased 450 mg daily from 350 mg daily.  Recent Dilantin level 01/12/20 was 13.5. CBC, CMP reviewed. Doing well, overall, health is stable, no dizziness or falls. Has 4 other housemates, he gets along well.  No seizures.  Presents today for evaluation accompanied by his caregiver, [name]. Sees PCP Dr. Ginette Cunningham routinely.   HISTORY 12/23/2018 SS: [name] is a 49 year old male with history of autism and seizure disorder.  He was last seen in February 2020 for a dose adjustment of Dilantin, after his Dilantin level was found to be low at 8.3.  Following his office visit, his dose was increased to 450 mg daily, from 350 mg daily.  His last seizure occurred September 14, 2016.  He remains on brand-name Dilantin.  He had a recent Dilantin level checked 12/18/2018 by his primary care doctor that resulted elevated at 21.  This level was not a trough level.  He is here today with his caregiver from the group home, [name].  He has not had any recent problems.  He has been doing well.  He has not had any falls.  The group home manages and administers his medications.  His primary care doctor is Dr. Ginette Cunningham at the [facility].  He presents today for follow-up accompanied by caregiver.   REVIEW OF SYSTEMS: Out of a complete 14 system review of symptoms, the patient complains only of the following symptoms, and all other reviewed systems are negative.  Seizures  ALLERGIES: No Known Allergies  HOME MEDICATIONS: Outpatient Medications Prior to Visit  Medication Sig Dispense Refill   acetaminophen  (TYLENOL) 650 MG CR tablet Take 650 mg by mouth every 8 (eight) hours as needed for pain.     benzonatate (TESSALON) 200 MG capsule Take 200 mg by mouth 3 (three) times daily as needed for cough.     chlorhexidine (PERIDEX) 0.12 % solution      cloNIDine (CATAPRES - DOSED IN MG/24 HR) 0.2 mg/24hr patch Place 1 patch onto the skin once a week.     cloNIDine (CATAPRES - DOSED IN MG/24 HR) 0.2 mg/24hr patch APPLY ONE PATCH ONTO THE SKIN ONCE A WEEK. *REMOVE OLD PATCH* *CHECK BP Q WEEK* (HYPERTENSION)     hydrocortisone 2.5 % cream Place rectally.     LORazepam (ATIVAN) 1 MG tablet Take 1 mg by mouth daily.     LORazepam (ATIVAN) 1 MG tablet Take 0.5 mg by mouth daily at 2 PM.     mometasone (ELOCON) 0.1 % cream Apply 1 application topically daily.     ondansetron (ZOFRAN-ODT) 4 MG disintegrating tablet TAKE ONE TABLET BY MOUTH EVERY 8 HOURS AS NEEDED FOR NAUSEA     polyethylene glycol (MIRALAX / GLYCOLAX) packet Mix one packet in 4 ounces of water on Tuesday and Thursday for constipation.     psyllium (METAMUCIL SMOOTH TEXTURE) 58.6 % powder MIX 1 PACKET AS DIRECTED & TAKE BY MOUTH TWICE DAILY     risperiDONE (RISPERDAL) 0.25 MG tablet      risperiDONE (RISPERDAL) 0.5 MG tablet Take 0.75 mg by mouth  3 (three) times daily. 1.5 tabs po TID     Sodium Fluoride (PREVIDENT 5000 PLUS DT) Place 1 Dose onto teeth as needed.     SUMAtriptan (IMITREX) 25 MG tablet Take by mouth.     triamcinolone lotion (KENALOG) 0.1 % Apply 1 application topically daily.     DILANTIN 100 MG ER capsule TAKE 4 CAPSULES (400MG ) BY MOUTH AT 7AM FOR SEIZURES. *BRAND NAME MEDICALLY REQUIRED* 120 capsule 12   DILANTIN INFATABS 50 MG tablet 1 tab every dday along with 400mg  dose Brand medically necessary 30 tablet 1   cetirizine (ZYRTEC) 10 MG tablet Take by mouth.     chlorproMAZINE (THORAZINE) 50 MG tablet Take 50 mg by mouth 3 (three) times daily as needed. Max 3 doses in 24 hrs as needed     fluticasone  (FLONASE) 50 MCG/ACT nasal spray Place into the nose.     hydrocortisone (ANUSOL-HC) 2.5 % rectal cream Place 2.5 application rectally 3 (three) times daily as needed for hemorrhoids or anal itching.      No facility-administered medications prior to visit.    PAST MEDICAL HISTORY: Past Medical History:  Diagnosis Date   Anxiety disorder    Autism    BPH (benign prostatic hyperplasia)    Dyslipidemia    Psychosis ([place])    Seizures ([place])    most recent sz 09/14/16    PAST SURGICAL HISTORY: No past surgical history on file.  FAMILY HISTORY: No family history on file.  SOCIAL HISTORY: Social History   Socioeconomic History   Marital status: Single    Spouse name: Not on file   Number of children: 0   Years of education: Not on file   Highest education level: Not on file  Occupational History   Not on file  Tobacco Use   Smoking status: Never Smoker   Smokeless tobacco: Never Used  Substance and Sexual Activity   Alcohol use: No   Drug use: No   Sexual activity: Not Currently  Other Topics Concern   Not on file  Social History Narrative   Not on file   Social Determinants of Health   Financial Resource Strain:    Difficulty of Paying Living Expenses: Not on file  Food Insecurity:    Worried About [place] in the Last Year: Not on file   Ran Out of Food in the Last Year: Not on file  Transportation Needs:    Lack of Transportation (Medical): Not on file   Lack of Transportation (Non-Medical): Not on file  Physical Activity:    Days of Exercise per Week: Not on file   Minutes of Exercise per Session: Not on file  Stress:    Feeling of Stress : Not on file  Social Connections:    Frequency of Communication with Friends and Family: Not on file   Frequency of Social Gatherings with Friends and Family: Not on file   Attends Religious Services: Not on file   Active Member of Clubs or Organizations: Not on file   Attends  Archivist Meetings: Not on file   Marital Status: Not on file  Intimate Partner Violence:    Fear of Current or Ex-Partner: Not on file   Emotionally Abused: Not on file   Physically Abused: Not on file   Sexually Abused: Not on file   PHYSICAL EXAM  Vitals:   02/03/20 1112  BP: 118/72  Pulse: 94  Weight: 188 lb 12.8 oz (85.6 kg)  Height: 5\' 9"  (1.753 m)   Body mass index is 27.88 kg/m.  Generalized: Well developed, in no acute distress, well-groomed, Neurological examination  Mentation: Alert, history is provided by his caregiver, speech is clear, somewhat abrupt, fast movements, follows exam commands Cranial nerve II-XII: Pupils were equal round reactive to light. Extraocular movements were full, visual field were full on confrontational test. Facial sensation and strength were normal. Head turning and shoulder shrug were normal and symmetric. Motor: The motor testing reveals 5 over 5 strength of all 4 extremities. Good symmetric motor tone is noted throughout.  Sensory: Sensory testing is intact to soft touch on all 4 extremities. No evidence of extinction is noted.  Coordination: Cerebellar testing reveals good finger-nose-finger and heel-to-shin bilaterally.  Gait and station: Gait is normal, swift gait pace Reflexes: Deep tendon reflexes are symmetric and normal bilaterally.   DIAGNOSTIC DATA (LABS, IMAGING, TESTING) - I reviewed patient records, labs, notes, testing and imaging myself where available.  Lab Results  Component Value Date   WBC 7.1 09/14/2016   HGB 13.5 09/14/2016   HCT 39.1 (L) 09/14/2016   MCV 100.5 (H) 09/14/2016   PLT 265 09/14/2016      Component Value Date/Time   NA 140 09/14/2016 1611   NA 137 03/13/2013 2100   K 4.2 09/14/2016 1611   K 3.7 03/13/2013 2100   CL 105 09/14/2016 1611   CL 105 03/13/2013 2100   CO2 27 09/14/2016 1611   CO2 31 03/13/2013 2100   GLUCOSE 89 09/14/2016 1611   GLUCOSE 105 (H) 03/13/2013 2100    BUN 9 09/14/2016 1611   BUN 14 03/13/2013 2100   CREATININE 0.93 09/14/2016 1611   CREATININE 1.15 03/13/2013 2100   CALCIUM 9.3 09/14/2016 1611   CALCIUM 9.1 03/13/2013 2100   PROT 7.3 03/13/2013 2100   ALBUMIN 4.0 03/13/2013 2100   AST 35 03/13/2013 2100   ALT 27 03/13/2013 2100   ALKPHOS 68 03/13/2013 2100   BILITOT 0.2 03/13/2013 2100   GFRNONAA >60 09/14/2016 1611   GFRNONAA >60 03/13/2013 2100   GFRAA >60 09/14/2016 1611   GFRAA >60 03/13/2013 2100   No results found for: CHOL, HDL, LDLCALC, LDLDIRECT, TRIG, CHOLHDL No results found for: HGBA1C No results found for: VITAMINB12 No results found for: TSH  ASSESSMENT AND PLAN 49 y.o. year old male  has a past medical history of Anxiety disorder, Autism, BPH (benign prostatic hyperplasia), Dyslipidemia, Psychosis ([place]), and Seizures ([place]). here with:  1.  Seizures 2.  Autism -No recurrent seizures, doing well on Dilantin -Continue Dilantin 450 mg daily -Dilantin level 13.5 on 01/12/2020 -Call for recurrent seizure, follow-up 1 year or sooner if needed  I spent 20 minutes of face-to-face and non-face-to-face time with patient.  This included previsit chart review, lab review, study review, order entry, electronic health record documentation, patient education.  Butler Denmark, AGNP-C, DNP 02/03/2020, 11:38 AM [facility] [address] [place], [place] [address] [phone]

## 2020-02-03 NOTE — Patient Instructions (Signed)
Continue current dose of Dilantin Blood level was within range Call for seizures  See you back in 1 year

## 2020-07-14 ENCOUNTER — Other Ambulatory Visit
Admission: RE | Admit: 2020-07-14 | Discharge: 2020-07-14 | Disposition: A | Discharge status: Home / Self Care | Payer: Medicare Other | Source: Ambulatory Visit | Attending: Internal Medicine | Admitting: Internal Medicine

## 2020-07-14 DIAGNOSIS — G40309 Generalized idiopathic epilepsy and epileptic syndromes, not intractable, without status epilepticus: Secondary | ICD-10-CM | POA: Insufficient documentation

## 2020-07-14 LAB — PHENYTOIN LEVEL, TOTAL: Phenytoin Lvl: 16.8 ug/mL (ref 10.0–20.0)

## 2021-01-12 ENCOUNTER — Telehealth: Payer: Self-pay | Admitting: Neurology

## 2021-01-12 NOTE — Telephone Encounter (Signed)
LVM & sent mychart msg informing pt of 11/23 appt being cancelled- [name] out.

## 2021-01-24 ENCOUNTER — Encounter: Payer: Self-pay | Admitting: Internal Medicine

## 2021-01-25 ENCOUNTER — Encounter: Payer: Self-pay | Admitting: Internal Medicine

## 2021-01-25 ENCOUNTER — Encounter
Admission: RE | Disposition: A | Discharge status: Home / Self Care | Payer: Self-pay | Source: Home / Self Care | Attending: Internal Medicine

## 2021-01-25 ENCOUNTER — Ambulatory Visit: Payer: Medicare Other | Admitting: Anesthesiology

## 2021-01-25 ENCOUNTER — Ambulatory Visit
Admission: RE | Admit: 2021-01-25 | Discharge: 2021-01-25 | Disposition: A | Discharge status: Home / Self Care | Payer: Medicare Other | Attending: Internal Medicine | Admitting: Internal Medicine

## 2021-01-25 DIAGNOSIS — D125 Benign neoplasm of sigmoid colon: Secondary | ICD-10-CM | POA: Insufficient documentation

## 2021-01-25 DIAGNOSIS — F419 Anxiety disorder, unspecified: Secondary | ICD-10-CM | POA: Diagnosis not present

## 2021-01-25 DIAGNOSIS — D123 Benign neoplasm of transverse colon: Secondary | ICD-10-CM | POA: Diagnosis not present

## 2021-01-25 DIAGNOSIS — E785 Hyperlipidemia, unspecified: Secondary | ICD-10-CM | POA: Insufficient documentation

## 2021-01-25 DIAGNOSIS — N400 Enlarged prostate without lower urinary tract symptoms: Secondary | ICD-10-CM | POA: Insufficient documentation

## 2021-01-25 DIAGNOSIS — Z1211 Encounter for screening for malignant neoplasm of colon: Secondary | ICD-10-CM | POA: Diagnosis present

## 2021-01-25 DIAGNOSIS — F840 Autistic disorder: Secondary | ICD-10-CM | POA: Insufficient documentation

## 2021-01-25 HISTORY — PX: COLONOSCOPY: SHX5424

## 2021-01-25 SURGERY — COLONOSCOPY
Anesthesia: General

## 2021-01-25 MED ORDER — PROPOFOL 10 MG/ML IV BOLUS
INTRAVENOUS | Status: DC | PRN
  Administered 2021-01-25 (×2): 30 mg via INTRAVENOUS
  Administered 2021-01-25: 60 mg via INTRAVENOUS

## 2021-01-25 MED ORDER — PROPOFOL 10 MG/ML IV BOLUS
INTRAVENOUS | Status: AC
  Filled 2021-01-25: qty 20

## 2021-01-25 MED ORDER — PROPOFOL 500 MG/50ML IV EMUL
INTRAVENOUS | Status: DC | PRN
  Administered 2021-01-25: 100 ug/kg/min via INTRAVENOUS

## 2021-01-25 MED ORDER — SODIUM CHLORIDE 0.9 % IV SOLN
INTRAVENOUS | Status: DC
  Administered 2021-01-25: 1000 mL via INTRAVENOUS

## 2021-01-25 MED ORDER — PHENYLEPHRINE HCL (PRESSORS) 10 MG/ML IV SOLN
INTRAVENOUS | Status: DC | PRN
  Administered 2021-01-25 (×4): 100 ug via INTRAVENOUS

## 2021-01-25 MED ORDER — PROPOFOL 10 MG/ML IV BOLUS
INTRAVENOUS | Status: AC
  Filled 2021-01-25: qty 160

## 2021-01-25 MED ORDER — PHENYLEPHRINE HCL (PRESSORS) 10 MG/ML IV SOLN
INTRAVENOUS | Status: AC
  Filled 2021-01-25: qty 1

## 2021-01-25 NOTE — Anesthesia Postprocedure Evaluation (Signed)
Anesthesia Post Note  Patient: [name]  Procedure(s) Performed: COLONOSCOPY  Patient location during evaluation: Endoscopy Anesthesia Type: General Level of consciousness: awake and alert Pain management: pain level controlled Vital Signs Assessment: post-procedure vital signs reviewed and stable Respiratory status: spontaneous breathing, nonlabored ventilation, respiratory function stable and patient connected to nasal cannula oxygen Cardiovascular status: blood pressure returned to baseline and stable Postop Assessment: no apparent nausea or vomiting Anesthetic complications: no   No notable events documented.   Last Vitals:  Vitals:   01/25/21 0908 01/25/21 0918  BP: (!) 91/58 114/77  Pulse: 75   Resp: (!) 27   Temp: (!) 35.9 C   SpO2: 98%     Last Pain:  Vitals:   01/25/21 0918  TempSrc:   PainSc: 0-No pain                 Margaree Mackintosh

## 2021-01-25 NOTE — Transfer of Care (Signed)
Immediate Anesthesia Transfer of Care Note  Patient: [name]  Procedure(s) Performed: COLONOSCOPY  Patient Location: PACU and Endoscopy Unit  Anesthesia Type:General  Level of Consciousness: awake, drowsy and patient cooperative  Airway & Oxygen Therapy: Patient Spontanous Breathing  Post-op Assessment: Report given to RN and Post -op Vital signs reviewed and stable  Post vital signs: Reviewed and stable  Last Vitals:  Vitals Value Taken Time  BP 91/58 01/25/21 0909  Temp    Pulse 77 01/25/21 0909  Resp 22 01/25/21 0909  SpO2 95 % 01/25/21 0909  Vitals shown include unvalidated device data.  Last Pain:  Vitals:   01/25/21 0759  TempSrc: Temporal         Complications: No notable events documented.

## 2021-01-25 NOTE — Interval H&P Note (Signed)
History and Physical Interval Note:  01/25/2021 8:24 AM  [name]  has presented today for surgery, with the diagnosis of (Z12.11) COLON CANCER SCREENING.  The various methods of treatment have been discussed with the patient and family. After consideration of risks, benefits and other options for treatment, the patient has consented to  Procedure(s): COLONOSCOPY (N/A) as a surgical intervention.  The patient's history has been reviewed, patient examined, no change in status, stable for surgery.  I have reviewed the patient's chart and labs.  Questions were answered to the patient's satisfaction.     Hannah, [place]

## 2021-01-25 NOTE — H&P (Signed)
Outpatient short stay form Pre-procedure 01/25/2021 8:24 AM [name] K. Alice Reichert, M.D.  Primary Physician: Tracie Harrier, M.D.  Reason for visit:  Colon cancer screening  History of present illness:  Patient presents for colonoscopy for colon cancer screening. The patient denies complaints of abdominal pain, significant change in bowel habits, or rectal bleeding.      Current Facility-Administered Medications:    0.9 %  sodium chloride infusion, , Intravenous, Continuous, [place], Benay Pike, MD, Last Rate: 20 mL/hr at 01/25/21 0810, 1,000 mL at 01/25/21 0810  Medications Prior to Admission  Medication Sig Dispense Refill Last Dose   acetaminophen (TYLENOL) 650 MG CR tablet Take 650 mg by mouth every 8 (eight) hours as needed for pain.   Past Week   chlorhexidine (PERIDEX) 0.12 % solution    Past Week   cloNIDine (CATAPRES - DOSED IN MG/24 HR) 0.2 mg/24hr patch APPLY ONE PATCH ONTO THE SKIN ONCE A WEEK. *REMOVE OLD PATCH* *CHECK BP Q WEEK* (HYPERTENSION)   01/24/2021   DILANTIN 100 MG ER capsule TAKE 4 CAPSULES (400MG ) BY MOUTH AT 7AM FOR SEIZURES. *BRAND NAME MEDICALLY REQUIRED* 120 capsule 12 01/25/2021 at 0600   DILANTIN INFATABS 50 MG tablet 1 tab every dday along with 400mg  dose Brand medically necessary 30 tablet 11 01/25/2021 at 0600   hydrocortisone 2.5 % cream Place rectally.   01/24/2021   LORazepam (ATIVAN) 1 MG tablet Take 0.5 mg by mouth daily at 2 PM.   01/24/2021   mometasone (ELOCON) 0.1 % cream Apply 1 application topically daily.   01/24/2021   polyethylene glycol (MIRALAX / GLYCOLAX) packet Mix one packet in 4 ounces of water on Tuesday and Thursday for constipation.   01/24/2021   psyllium (METAMUCIL SMOOTH TEXTURE) 58.6 % powder MIX 1 PACKET AS DIRECTED & TAKE BY MOUTH TWICE DAILY   Past Week   risperiDONE (RISPERDAL) 0.25 MG tablet    01/24/2021   risperiDONE (RISPERDAL) 0.5 MG tablet Take 0.75 mg by mouth 3 (three) times daily. 1.5 tabs po TID   01/24/2021   Sodium  Fluoride (PREVIDENT 5000 PLUS DT) Place 1 Dose onto teeth as needed.   01/24/2021   triamcinolone lotion (KENALOG) 0.1 % Apply 1 application topically daily.   01/24/2021   benzonatate (TESSALON) 200 MG capsule Take 200 mg by mouth 3 (three) times daily as needed for cough.    at prn   cetirizine (ZYRTEC) 10 MG tablet Take by mouth.      cloNIDine (CATAPRES - DOSED IN MG/24 HR) 0.2 mg/24hr patch Place 1 patch onto the skin once a week.      LORazepam (ATIVAN) 1 MG tablet Take 1 mg by mouth daily.      ondansetron (ZOFRAN-ODT) 4 MG disintegrating tablet TAKE ONE TABLET BY MOUTH EVERY 8 HOURS AS NEEDED FOR NAUSEA    at prn   SUMAtriptan (IMITREX) 25 MG tablet Take by mouth.    at prn     No Known Allergies   Past Medical History:  Diagnosis Date   Anxiety disorder    Autism    BPH (benign prostatic hyperplasia)    Dyslipidemia    Psychosis ([place])    Seizures ([place])    most recent sz 09/14/16    Review of systems:  Otherwise negative.    Physical Exam  Gen: Alert, oriented. Appears stated age.  HEENT: [place]/AT. PERRLA. Lungs: CTA, no wheezes. CV: RR nl S1, S2. Abd: soft, benign, no masses. BS+ Ext: No edema. Pulses 2+  Planned procedures: Proceed with colonoscopy. The patient understands the nature of the planned procedure, indications, risks, alternatives and potential complications including but not limited to bleeding, infection, perforation, damage to internal organs and possible oversedation/side effects from anesthesia. The patient agrees and gives consent to proceed.  Please refer to procedure notes for findings, recommendations and patient disposition/instructions.     [name] K. Alice Reichert, M.D. Gastroenterology 01/25/2021  8:24 AM

## 2021-01-25 NOTE — Op Note (Signed)
[facility] Gastroenterology Patient Name: [name] Procedure Date: 01/25/2021 8:33 AM MRN: [id number] Account #: [id number] Date of Birth: [birth year] Admit Type: Outpatient Age: 50 Room: [facility] ENDO ROOM 2 Gender: Male Note Status: Finalized Instrument Name: Jasper Riling [id number] Procedure:             Colonoscopy Indications:           Screening for colorectal malignant neoplasm Providers:             Lorie Apley K. Alice Reichert MD, MD Referring MD:          Tracie Harrier, MD (Referring MD) Medicines:             Propofol per Anesthesia Complications:         No immediate complications. Procedure:             Pre-Anesthesia Assessment:                        - The risks and benefits of the procedure and the                         sedation options and risks were discussed with the                         patient. All questions were answered and informed                         consent was obtained.                        - Patient identification and proposed procedure were                         verified prior to the procedure by the nurse. The                         procedure was verified in the procedure room.                        - ASA Grade Assessment: III - A patient with severe                         systemic disease.                        - After reviewing the risks and benefits, the patient                         was deemed in satisfactory condition to undergo the                         procedure.                        After obtaining informed consent, the colonoscope was                         passed under direct vision. Throughout the procedure,                         the  patient's blood pressure, pulse, and oxygen                         saturations were monitored continuously. The                         Colonoscope was introduced through the anus and                         advanced to the the terminal ileum, with                          identification of the appendiceal orifice and IC                         valve. The colonoscopy was somewhat difficult due to                         poor endoscopic visualization. Successful completion                         of the procedure was aided by lavage. The patient                         tolerated the procedure well. The quality of the bowel                         preparation was adequate. The ileocecal valve,                         appendiceal orifice, and rectum were photographed. Findings:      The perianal and digital rectal examinations were normal. Pertinent       negatives include normal sphincter tone and no palpable rectal lesions.      A 4 mm polyp was found in the transverse colon. The polyp was sessile.       The polyp was removed with a jumbo cold forceps. Resection and retrieval       were complete.      A 9 mm polyp was found in the sigmoid colon. The polyp was       semi-pedunculated. The polyp was removed with a cold snare. Resection       and retrieval were complete.      The terminal ileum appeared normal.      The exam was otherwise without abnormality on direct and retroflexion       views. Impression:            - One 4 mm polyp in the transverse colon, removed with                         a jumbo cold forceps. Resected and retrieved.                        - One 9 mm polyp in the sigmoid colon, removed with a                         cold snare. Resected and retrieved.                        -  The examined portion of the ileum was normal.                        - The examination was otherwise normal on direct and                         retroflexion views. Recommendation:        - Patient has a contact number available for                         emergencies. The signs and symptoms of potential                         delayed complications were discussed with the patient.                         Return to normal activities tomorrow. Written                          discharge instructions were provided to the patient.                        - Resume previous diet.                        - Continue present medications.                        - Repeat colonoscopy is recommended for surveillance.                         The colonoscopy date will be determined after                         pathology results from today's exam become available                         for review.                        - Return to GI office PRN.                        - The findings and recommendations were discussed with                         the patient and their designated guardian. Procedure Code(s):     --- Professional ---                        [phone], Colonoscopy, flexible; with removal of                         tumor(s), polyp(s), or other lesion(s) by snare                         technique                        15176, 59, Colonoscopy, flexible; with biopsy, single  or multiple Diagnosis Code(s):     --- Professional ---                        K63.5, Polyp of colon                        Z12.11, Encounter for screening for malignant neoplasm                         of colon CPT copyright 2019 American Medical Association. All rights reserved. The codes documented in this report are preliminary and upon coder review may  be revised to meet current compliance requirements. Efrain Sella MD, MD 01/25/2021 9:10:22 AM This report has been signed electronically. Number of Addenda: 0 Note Initiated On: 01/25/2021 8:33 AM Scope Withdrawal Time: 0 hours 10 minutes 9 seconds  Total Procedure Duration: 0 hours 19 minutes 47 seconds  Estimated Blood Loss:  Estimated blood loss: none. Estimated blood loss: none.      [facility]

## 2021-01-25 NOTE — Anesthesia Preprocedure Evaluation (Signed)
Anesthesia Evaluation  Patient identified by MRN, date of birth, ID band Patient awake    Reviewed: Allergy & Precautions, NPO status , Patient's Chart, lab work & pertinent test results  Airway Mallampati: III  TM Distance: >3 FB Neck ROM: full    Dental  (+) Edentulous Upper, Edentulous Lower   Pulmonary neg pulmonary ROS,    Pulmonary exam normal        Cardiovascular negative cardio ROS Normal cardiovascular exam     Neuro/Psych negative neurological ROS  negative psych ROS   GI/Hepatic negative GI ROS, Neg liver ROS,   Endo/Other  negative endocrine ROS  Renal/GU negative Renal ROS  negative genitourinary   Musculoskeletal   Abdominal Normal abdominal exam  (+)   Peds  Hematology negative hematology ROS (+)   Anesthesia Other Findings Past Medical History: No date: Anxiety disorder No date: Autism No date: BPH (benign prostatic hyperplasia) No date: Dyslipidemia No date: Psychosis ([place]) No date: Seizures ([place])     Comment:  most recent sz 09/14/16  History reviewed. No pertinent surgical history.     Reproductive/Obstetrics negative OB ROS                             Anesthesia Physical Anesthesia Plan  ASA: 2  Anesthesia Plan: General   Post-op Pain Management:    Induction: Intravenous  PONV Risk Score and Plan: Propofol infusion and TIVA  Airway Management Planned: Natural Airway and Nasal Cannula  Additional Equipment:   Intra-op Plan:   Post-operative Plan:   Informed Consent: I have reviewed the patients History and Physical, chart, labs and discussed the procedure including the risks, benefits and alternatives for the proposed anesthesia with the patient or authorized representative who has indicated his/her understanding and acceptance.     Dental Advisory Given  Plan Discussed with: Anesthesiologist, CRNA and Surgeon  Anesthesia Plan Comments:  (Patient consented for risks of anesthesia including but not limited to:  - adverse reactions to medications - risk of airway placement if required - damage to eyes, teeth, lips or other oral mucosa - nerve damage due to positioning  - sore throat or hoarseness - Damage to heart, brain, nerves, lungs, other parts of body or loss of life  Patient voiced understanding.)        Anesthesia Quick Evaluation

## 2021-01-26 ENCOUNTER — Encounter: Payer: Self-pay | Admitting: Internal Medicine

## 2021-01-26 LAB — SURGICAL PATHOLOGY

## 2021-02-01 ENCOUNTER — Ambulatory Visit: Payer: Medicare Other | Admitting: Neurology

## 2021-02-06 ENCOUNTER — Other Ambulatory Visit: Payer: Self-pay | Admitting: Neurology

## 2021-02-22 ENCOUNTER — Ambulatory Visit (INDEPENDENT_AMBULATORY_CARE_PROVIDER_SITE_OTHER): Payer: Medicare Other | Admitting: Neurology

## 2021-02-22 ENCOUNTER — Other Ambulatory Visit: Payer: Self-pay

## 2021-02-22 ENCOUNTER — Encounter: Payer: Self-pay | Admitting: Neurology

## 2021-02-22 VITALS — BP 114/73 | HR 91 | Ht 69.0 in | Wt 173.4 lb

## 2021-02-22 DIAGNOSIS — G40309 Generalized idiopathic epilepsy and epileptic syndromes, not intractable, without status epilepticus: Secondary | ICD-10-CM

## 2021-02-22 MED ORDER — DILANTIN 100 MG PO CAPS: ORAL_CAPSULE | ORAL | 12 refills | Status: DC

## 2021-02-22 MED ORDER — DILANTIN INFATABS 50 MG PO CHEW: CHEWABLE_TABLET | ORAL | 10 refills | Status: DC

## 2021-02-22 NOTE — Progress Notes (Signed)
PATIENT: [name] DOB: [birth year]  REASON FOR VISIT: follow up HISTORY FROM: patient Primary Neurologist: Dr. Jannifer Franklin has retired now, will be followed by Dr. Brett Fairy   HISTORY OF PRESENT ILLNESS: Today 02/22/21 [name] is here today for follow-up. No recent seizures. Remains on brand name Dilantin, tolerating well. Lives in group home. Reviewed labs from PCP Sept 2022 CMP was normal, TSH was 4.023, CBC okay except mild increased MCV, MCH, HGB was 14.1. No issues today. Here today with caretaker, Adonis Huguenin.   Update 02/03/2020 SS:[name] is a 50 year old male with history of autism and seizure disorder.  Seizures remain well controlled, last was in July 2018.  He is on brand-name Dilantin, tolerating well.  He lives in a group home.  In February 2020, his Dilantin level was low at 8.3, dose was increased 450 mg daily from 350 mg daily.  Recent Dilantin level 01/12/20 was 13.5. CBC, CMP reviewed. Doing well, overall, health is stable, no dizziness or falls. Has 4 other housemates, he gets along well.  No seizures.  Presents today for evaluation accompanied by his caregiver, Jocelyn Lamer. Sees PCP Dr. Ginette Pitman routinely.   HISTORY 12/23/2018 SS: [name] is a 50 year old male with history of autism and seizure disorder.  He was last seen in February 2020 for a dose adjustment of Dilantin, after his Dilantin level was found to be low at 8.3.  Following his office visit, his dose was increased to 450 mg daily, from 350 mg daily.  His last seizure occurred September 14, 2016.  He remains on brand-name Dilantin.  He had a recent Dilantin level checked 12/18/2018 by his primary care doctor that resulted elevated at 21.  This level was not a trough level.  He is here today with his caregiver from the group home, Jocelyn Lamer.  He has not had any recent problems.  He has been doing well.  He has not had any falls.  The group home manages and administers his medications.  His primary care doctor is Dr[facility] Ginette [facility] at [facility]  He presents today for follow-up accompanied by caregiver.   REVIEW OF SYSTEMS: Out of a complete 14 system review of symptoms, the patient complains only of the following symptoms, and all other reviewed systems are negative.  Seizures  ALLERGIES: No Known Allergies  HOME MEDICATIONS: Outpatient Medications Prior to Visit  Medication Sig Dispense Refill   acetaminophen (TYLENOL) 650 MG CR tablet Take 650 mg by mouth every 8 (eight) hours as needed for pain.     benzonatate (TESSALON) 200 MG capsule Take 200 mg by mouth 3 (three) times daily as needed for cough.     chlorhexidine (PERIDEX) 0.12 % solution      cloNIDine (CATAPRES - DOSED IN MG/24 HR) 0.2 mg/24hr patch Place 1 patch onto the skin once a week.     cloNIDine (CATAPRES - DOSED IN MG/24 HR) 0.2 mg/24hr patch APPLY ONE PATCH ONTO THE SKIN ONCE A WEEK. *REMOVE OLD PATCH* *CHECK BP Q WEEK* (HYPERTENSION)     hydrocortisone 2.5 % cream Place rectally.     LORazepam (ATIVAN) 1 MG tablet Take 1 mg by mouth daily.     LORazepam (ATIVAN) 1 MG tablet Take 0.5 mg by mouth daily at 2 PM.     mometasone (ELOCON) 0.1 % cream Apply 1 application topically daily.     ondansetron (ZOFRAN-ODT) 4 MG disintegrating tablet TAKE ONE TABLET BY MOUTH EVERY 8 HOURS AS NEEDED FOR NAUSEA  polyethylene glycol (MIRALAX / GLYCOLAX) packet Mix one packet in 4 ounces of water on Tuesday and Thursday for constipation.     psyllium (METAMUCIL SMOOTH TEXTURE) 58.6 % powder MIX 1 PACKET AS DIRECTED & TAKE BY MOUTH TWICE DAILY     risperiDONE (RISPERDAL) 0.25 MG tablet      risperiDONE (RISPERDAL) 0.5 MG tablet Take 0.75 mg by mouth 3 (three) times daily. 1.5 tabs po TID     Sodium Fluoride (PREVIDENT 5000 PLUS DT) Place 1 Dose onto teeth as needed.     SUMAtriptan (IMITREX) 25 MG tablet Take by mouth.     triamcinolone lotion (KENALOG) 0.1 % Apply 1 application topically daily.     DILANTIN 100 MG ER capsule TAKE 4 CAPSULES (400MG ) BY MOUTH  AT 7AM FOR SEIZURES. *BRAND NAME MEDICALLY REQUIRED* 120 capsule 12   DILANTIN INFATABS 50 MG tablet TAKE 1 TABLET BY MOUTH ONCE DAILY ALONG WITH 400MG  DOSE * BRAND NAME* *HAZARDOUS DRUG: WEAR GLOVES* 7 tablet 10   cetirizine (ZYRTEC) 10 MG tablet Take by mouth.     No facility-administered medications prior to visit.    PAST MEDICAL HISTORY: Past Medical History:  Diagnosis Date   Anxiety disorder    Autism    BPH (benign prostatic hyperplasia)    Dyslipidemia    Psychosis ([place])    Seizures ([place])    most recent sz 09/14/16    PAST SURGICAL HISTORY: Past Surgical History:  Procedure Laterality Date   COLONOSCOPY N/A 01/25/2021   Procedure: COLONOSCOPY;  Surgeon: [place], Benay Pike, MD;  Location: [facility] ENDOSCOPY;  Service: Gastroenterology;  Laterality: N/A;    FAMILY HISTORY: History reviewed. No pertinent family history.  SOCIAL HISTORY: Social History   Socioeconomic History   Marital status: Single    Spouse name: Not on file   Number of children: 0   Years of education: Not on file   Highest education level: Not on file  Occupational History   Not on file  Tobacco Use   Smoking status: Never   Smokeless tobacco: Never  Vaping Use   Vaping Use: Never used  Substance and Sexual Activity   Alcohol use: No   Drug use: No   Sexual activity: Not Currently  Other Topics Concern   Not on file  Social History Narrative   Not on file   Social Determinants of Health   Financial Resource Strain: Not on file  Food Insecurity: Not on file  Transportation Needs: Not on file  Physical Activity: Not on file  Stress: Not on file  Social Connections: Not on file  Intimate Partner Violence: Not on file   PHYSICAL EXAM  Vitals:   02/22/21 1438  BP: 114/73  Pulse: 91  Weight: 173 lb 6.4 oz (78.7 kg)  Height: 5\' 9"  (1.753 m)    Body mass index is 25.61 kg/m.  Generalized: Well developed, in no acute distress, well-groomed Neurological examination  Mentation:  Alert, history is provided by his caregiver, speech is loud, somewhat abrupt, fast movements, follows most exam commands Cranial nerve II-XII: Pupils were equal round reactive to light. Extraocular movements were full, visual field were full on confrontational test. Facial sensation and strength were normal. Head turning and shoulder shrug were normal and symmetric. Motor: The motor testing reveals 5 over 5 strength of all 4 extremities. Good symmetric motor tone is noted throughout.  Sensory: Sensory testing is intact to soft touch on all 4 extremities. No evidence of extinction is noted.  Coordination:  Cerebellar testing reveals good finger-nose-finger and heel-to-shin bilaterally.  Gait and station: Gait is normal, fast pace Reflexes: Deferred  DIAGNOSTIC DATA (LABS, IMAGING, TESTING) - I reviewed patient records, labs, notes, testing and imaging myself where available.  Lab Results  Component Value Date   WBC 7.1 09/14/2016   HGB 13.5 09/14/2016   HCT 39.1 (L) 09/14/2016   MCV 100.5 (H) 09/14/2016   PLT 265 09/14/2016      Component Value Date/Time   NA 140 09/14/2016 1611   NA 137 03/13/2013 2100   K 4.2 09/14/2016 1611   K 3.7 03/13/2013 2100   CL 105 09/14/2016 1611   CL 105 03/13/2013 2100   CO2 27 09/14/2016 1611   CO2 31 03/13/2013 2100   GLUCOSE 89 09/14/2016 1611   GLUCOSE 105 (H) 03/13/2013 2100   BUN 9 09/14/2016 1611   BUN 14 03/13/2013 2100   CREATININE 0.93 09/14/2016 1611   CREATININE 1.15 03/13/2013 2100   CALCIUM 9.3 09/14/2016 1611   CALCIUM 9.1 03/13/2013 2100   PROT 7.3 03/13/2013 2100   ALBUMIN 4.0 03/13/2013 2100   AST 35 03/13/2013 2100   ALT 27 03/13/2013 2100   ALKPHOS 68 03/13/2013 2100   BILITOT 0.2 03/13/2013 2100   GFRNONAA >60 09/14/2016 1611   GFRNONAA >60 03/13/2013 2100   GFRAA >60 09/14/2016 1611   GFRAA >60 03/13/2013 2100   No results found for: CHOL, HDL, LDLCALC, LDLDIRECT, TRIG, CHOLHDL No results found for: HGBA1C No results  found for: VITAMINB12 No results found for: TSH  ASSESSMENT AND PLAN 50 y.o. year old male  has a past medical history of Anxiety disorder, Autism, BPH (benign prostatic hyperplasia), Dyslipidemia, Psychosis ([place]), and Seizures ([place]). here with:  1.  Seizures 2.  Autism  -Doing well today, no issues -Check Dilantin level  -Reviewed recent PCP routine labs, were ok  -Continue Dilantin 450 mg daily -Call for recurrent seizure, follow-up 1 year or sooner if needed -Since Dr. Jannifer Franklin retired will be followed by Dr. Kathyrn Sheriff, AGNP-C, DNP 02/22/2021, 3:19 PM [facility] [address] [place], [place] [address] [phone]

## 2021-02-22 NOTE — Patient Instructions (Signed)
Continue current medications  Check dilantin blood level today  Call for any seizures See you back in 1 year

## 2021-02-23 ENCOUNTER — Telehealth: Payer: Self-pay | Admitting: Neurology

## 2021-02-23 DIAGNOSIS — R569 Unspecified convulsions: Secondary | ICD-10-CM

## 2021-02-23 DIAGNOSIS — Z5181 Encounter for therapeutic drug level monitoring: Secondary | ICD-10-CM

## 2021-02-23 LAB — PHENYTOIN LEVEL, TOTAL: Phenytoin (Dilantin), Serum: 23.1 ug/mL (ref 10.0–20.0)

## 2021-02-23 NOTE — Telephone Encounter (Signed)
Asked to faxed information to facility To: [name] -fax number - [phone]

## 2021-02-23 NOTE — Telephone Encounter (Signed)
Dilantin level returned elevated yesterday 23.1. There were no signs of toxicity on exam. Would recommend rechecking this and review signs of toxicity with caregiver to watch for. This could have been related to timing of dose. I have on file he takes a total of 450 mg of Dilantin. We could verify the timing of medication in comparison to lab draw, but I would still recheck the level even as trough if possible. Thanks.

## 2021-02-23 NOTE — Telephone Encounter (Addendum)
I spoke to [place], at the patient's group home, who scheduled the appt for repeat labs. He is coming on Monday, 02/27/21, at 8am. They will hold his morning dose until after his repeat labs have been drawn.

## 2021-02-23 NOTE — Addendum Note (Signed)
Addended by: Noberto Retort C on: 02/23/2021 12:55 PM   Modules accepted: Orders

## 2021-02-27 ENCOUNTER — Telehealth: Payer: Self-pay | Admitting: Neurology

## 2021-02-27 ENCOUNTER — Other Ambulatory Visit: Payer: Self-pay

## 2021-02-27 ENCOUNTER — Other Ambulatory Visit (INDEPENDENT_AMBULATORY_CARE_PROVIDER_SITE_OTHER): Payer: Self-pay

## 2021-02-27 DIAGNOSIS — Z0289 Encounter for other administrative examinations: Secondary | ICD-10-CM

## 2021-02-27 DIAGNOSIS — R569 Unspecified convulsions: Secondary | ICD-10-CM

## 2021-02-27 DIAGNOSIS — Z5181 Encounter for therapeutic drug level monitoring: Secondary | ICD-10-CM

## 2021-02-27 NOTE — Telephone Encounter (Signed)
Pt had labs today and had questions about taking his DILANTIN 100 MG ER capsule. Would like a call from the nurse.

## 2021-02-27 NOTE — Telephone Encounter (Signed)
I called patient, spoke with [name], per DPR. He wanted to make sure that patient could resume his normal dosing of dilantin after his labs. I advised him to resume the patient's prescribed dilantin dose today. The results of the phenytoin lab should be ready in the next day or two and we will call with those results and further dosing instructions when available. Gwyndolyn Saxon verbalized understanding.

## 2021-02-28 ENCOUNTER — Telehealth: Payer: Self-pay | Admitting: *Deleted

## 2021-02-28 LAB — PHENYTOIN LEVEL, TOTAL: Phenytoin (Dilantin), Serum: 3.2 ug/mL — ABNORMAL LOW (ref 10.0–20.0)

## 2021-02-28 NOTE — Telephone Encounter (Signed)
-----   Message from Suzzanne Cloud, NP sent at 02/28/2021 11:35 AM EST ----- Blood level is now low. He can continue with current dose that has been taking, let us know of any seizure issues. Previous level seems timing related. I hope they didn't hold any weekend doses prior to most recent blood draw to account for large swing.

## 2021-02-28 NOTE — Telephone Encounter (Signed)
The patient resides in [place] [phone]). I called and spoke to [place]. He verbalized understanding of the information below. He would also like the results and message faxed to his attention at [phone].

## 2021-05-22 ENCOUNTER — Telehealth: Payer: Self-pay | Admitting: Neurology

## 2021-05-22 NOTE — Telephone Encounter (Signed)
[facility]) attempting to fax PA request for DILANTIN 100 MG ER capsule ?[employer] requesting the PA. Would like a call from the nurse. ?

## 2021-05-23 NOTE — Telephone Encounter (Signed)
PA started on covermymeds (key: [id number]). Pt has pharmacy coverage through [facility] [phone]). PA Case: [id number] approved through 03/11/22. [place] notified.  ?

## 2021-06-14 ENCOUNTER — Other Ambulatory Visit
Admission: RE | Admit: 2021-06-14 | Discharge: 2021-06-14 | Disposition: A | Discharge status: Home / Self Care | Payer: Medicare Other | Source: Ambulatory Visit | Attending: Internal Medicine | Admitting: Internal Medicine

## 2021-06-14 DIAGNOSIS — G40909 Epilepsy, unspecified, not intractable, without status epilepticus: Secondary | ICD-10-CM | POA: Diagnosis present

## 2021-06-14 LAB — PHENYTOIN LEVEL, TOTAL: Phenytoin Lvl: 21.3 ug/mL — ABNORMAL HIGH (ref 10.0–20.0)

## 2022-01-23 ENCOUNTER — Other Ambulatory Visit: Payer: Self-pay | Admitting: Neurology

## 2022-02-22 ENCOUNTER — Ambulatory Visit: Payer: Medicare Other | Admitting: Neurology

## 2022-02-22 ENCOUNTER — Other Ambulatory Visit
Admission: RE | Admit: 2022-02-22 | Discharge: 2022-02-22 | Disposition: A | Discharge status: Home / Self Care | Payer: Medicare Other | Source: Ambulatory Visit | Attending: Internal Medicine | Admitting: Internal Medicine

## 2022-02-22 ENCOUNTER — Telehealth: Payer: Self-pay | Admitting: Neurology

## 2022-02-22 DIAGNOSIS — G40909 Epilepsy, unspecified, not intractable, without status epilepticus: Secondary | ICD-10-CM | POA: Insufficient documentation

## 2022-02-22 DIAGNOSIS — R7889 Finding of other specified substances, not normally found in blood: Secondary | ICD-10-CM | POA: Diagnosis present

## 2022-02-22 LAB — PHENYTOIN LEVEL, TOTAL: Phenytoin Lvl: 34.1 ug/mL (ref 10.0–20.0)

## 2022-02-22 NOTE — Telephone Encounter (Signed)
Contacted [name] back, informed her I can see the lab regarding Phenytoin Lvl being high. Will advise NP as FYI. She was appreciative.

## 2022-02-22 NOTE — Telephone Encounter (Signed)
[name] is called from pt PCP. Stated she needs to give nurse a critical lab. She is reqiesting a call back from nurse at [phone]

## 2022-02-22 NOTE — Telephone Encounter (Signed)
I called [facility] spoke with the nurse, Gregary Signs, Dilantin level was 34.1, collected 8:16 AM. I am suspicious he may have taken his medication right before blood draw. I called his caregiver who brought him, not clear if he had his medication before lab draw. Will recheck tomorrow morning as trough level for accuracy. Denies any symptoms of toxicity. He is prescribed 450 mg Dilantin daily.

## 2022-02-22 NOTE — Telephone Encounter (Signed)
Noted, thank you

## 2022-02-23 ENCOUNTER — Other Ambulatory Visit
Admission: RE | Admit: 2022-02-23 | Discharge: 2022-02-23 | Disposition: A | Discharge status: Home / Self Care | Payer: Medicare Other | Source: Ambulatory Visit | Attending: Infectious Diseases | Admitting: Infectious Diseases

## 2022-02-23 DIAGNOSIS — R7889 Finding of other specified substances, not normally found in blood: Secondary | ICD-10-CM | POA: Insufficient documentation

## 2022-02-23 LAB — PHENYTOIN LEVEL, TOTAL: Phenytoin Lvl: 33.3 ug/mL (ref 10.0–20.0)

## 2022-02-26 ENCOUNTER — Telehealth: Payer: Self-pay | Admitting: Neurology

## 2022-02-26 NOTE — Telephone Encounter (Signed)
I called [facility] and spoke with Tammy, she will fax the report to our office. Since she was not a clinical representative she could not verbally give the result. Will await fax from the office.

## 2022-02-26 NOTE — Telephone Encounter (Signed)
Can you follow up with [facility] clinic to verify what repeat Dilantin level was? I can't see the direct attachment in the chart. If elevated, we need to verify with his caregiver that he did not receive any Dilantin before blood draw and verify dosing as 450 mg daily of Dilantin? Any new medicine? Weight loss? We will likely need to reduce the dose. Thanks

## 2022-02-26 NOTE — Progress Notes (Unsigned)
PATIENT: [name] DOB: 1970/04/05  REASON FOR VISIT: follow up HISTORY FROM: patient Primary Neurologist: [name]/Camara  HISTORY OF PRESENT ILLNESS: Today 02/27/22 Here today for follow up, had dilantin level drawn at PCP 02/22/22 34.1, 02/23/22 33.3. these were trough levels reportedly. Takes Dilantin 450 mg daily in the AM. Denies any symptoms, but has been putting his shirt on backwards for a few months, after mentioning symptoms caregiver reports potential shakiness in hands. No falls. Behavior is the same, is loud. Last seizure was several years ago. No new medications or changes to account for the swing.  Update 02/22/21 SS: Clarence is here today for follow-up. No recent seizures. Remains on brand name Dilantin, tolerating well. Lives in group home. Reviewed labs from PCP Sept 2022 CMP was normal, TSH was 4.023, CBC okay except mild increased MCV, MCH, HGB was 14.1. No issues today. Here today with caretaker, Adonis Huguenin.   Update 02/03/2020 SS:[name] is a 51 year old male with history of autism and seizure disorder.  Seizures remain well controlled, last was in July 2018.  He is on brand-name Dilantin, tolerating well.  He lives in a group home.  In February 2020, his Dilantin level was low at 8.3, dose was increased 450 mg daily from 350 mg daily.  Recent Dilantin level 01/12/20 was 13.5. CBC, CMP reviewed. Doing well, overall, health is stable, no dizziness or falls. Has 4 other housemates, he gets along well.  No seizures.  Presents today for evaluation accompanied by his caregiver, Jocelyn Lamer. Sees PCP Dr. Ginette Pitman routinely.   HISTORY 12/23/2018 SS: Mr. Vanness is a 51 year old male with history of autism and seizure disorder.  He was last seen in February 2020 for a dose adjustment of Dilantin, after his Dilantin level was found to be low at 8.3.  Following his office visit, his dose was increased to 450 mg daily, from 350 mg daily.  His last seizure occurred September 14, 2016.  He remains  on brand-name Dilantin.  He had a recent Dilantin level checked 12/18/2018 by his primary care doctor that resulted elevated at 21.  This level was not a trough level.  He is here today with his caregiver from the group home, Jocelyn Lamer.  He has not had any recent problems.  He has been doing well.  He has not had any falls.  The group home manages and administers his medications.  His primary care doctor is Dr. Gin[facility] at [facility].  He presents today for follow-up accompanied by caregiver.   REVIEW OF SYSTEMS: Out of a complete 14 system review of symptoms, the patient complains only of the following symptoms, and all other reviewed systems are negative.  Seizures  ALLERGIES: No Known Allergies  HOME MEDICATIONS: Outpatient Medications Prior to Visit  Medication Sig Dispense Refill   acetaminophen (TYLENOL) 650 MG CR tablet Take 650 mg by mouth every 8 (eight) hours as needed for pain.     benzonatate (TESSALON) 200 MG capsule Take 200 mg by mouth 3 (three) times daily as needed for cough.     cetirizine (ZYRTEC) 10 MG tablet Take by mouth.     chlorhexidine (PERIDEX) 0.12 % solution      cloNIDine (CATAPRES - DOSED IN MG/24 HR) 0.2 mg/24hr patch Place 1 patch onto the skin once a week.     cloNIDine (CATAPRES - DOSED IN MG/24 HR) 0.2 mg/24hr patch APPLY ONE PATCH ONTO THE SKIN ONCE A WEEK. *REMOVE OLD PATCH* *CHECK BP Q WEEK* (HYPERTENSION)  DILANTIN 100 MG ER capsule TAKE 4 CAPSULES ('400MG'$ ) BY MOUTH AT 7AM FOR SEIZURES. *BRAND NAME MEDICALLY REQUIRED* 120 capsule 12   DILANTIN INFATABS 50 MG tablet TAKE 1 TABLET BY MOUTH ONCE DAILY ALONG WITH '400MG'$  DOSE *HAZARDOUS DRUG: WEAR GLOVES* 7 tablet 10   hydrocortisone 2.5 % cream Place rectally.     LORazepam (ATIVAN) 1 MG tablet Take 1 mg by mouth daily.     LORazepam (ATIVAN) 1 MG tablet Take 0.5 mg by mouth daily at 2 PM.     mometasone (ELOCON) 0.1 % cream Apply 1 application topically daily.     ondansetron (ZOFRAN-ODT) 4 MG  disintegrating tablet TAKE ONE TABLET BY MOUTH EVERY 8 HOURS AS NEEDED FOR NAUSEA     polyethylene glycol (MIRALAX / GLYCOLAX) packet Mix one packet in 4 ounces of water on Tuesday and Thursday for constipation.     psyllium (METAMUCIL SMOOTH TEXTURE) 58.6 % powder MIX 1 PACKET AS DIRECTED & TAKE BY MOUTH TWICE DAILY     risperiDONE (RISPERDAL) 0.25 MG tablet      risperiDONE (RISPERDAL) 0.5 MG tablet Take 0.75 mg by mouth 3 (three) times daily. 1.5 tabs po TID     Sodium Fluoride (PREVIDENT 5000 PLUS DT) Place 1 Dose onto teeth as needed.     SUMAtriptan (IMITREX) 25 MG tablet Take by mouth.     triamcinolone lotion (KENALOG) 0.1 % Apply 1 application topically daily.     No facility-administered medications prior to visit.    PAST MEDICAL HISTORY: Past Medical History:  Diagnosis Date   Anxiety disorder    Autism    BPH (benign prostatic hyperplasia)    Dyslipidemia    Psychosis ([place])    Seizures ([place])    most recent sz 09/14/16    PAST SURGICAL HISTORY: Past Surgical History:  Procedure Laterality Date   COLONOSCOPY N/A 01/25/2021   Procedure: COLONOSCOPY;  Surgeon: Toledo, Benay Pike, MD;  Location: [facility] ENDOSCOPY;  Service: Gastroenterology;  Laterality: N/A;    FAMILY HISTORY: No family history on file.  SOCIAL HISTORY: Social History   Socioeconomic History   Marital status: Single    Spouse name: Not on file   Number of children: 0   Years of education: Not on file   Highest education level: Not on file  Occupational History   Not on file  Tobacco Use   Smoking status: Never   Smokeless tobacco: Never  Vaping Use   Vaping Use: Never used  Substance and Sexual Activity   Alcohol use: No   Drug use: No   Sexual activity: Not Currently  Other Topics Concern   Not on file  Social History Narrative   Not on file   Social Determinants of Health   Financial Resource Strain: Not on file  Food Insecurity: Not on file  Transportation Needs: Not on file   Physical Activity: Not on file  Stress: Not on file  Social Connections: Not on file  Intimate Partner Violence: Not on file   PHYSICAL EXAM  Vitals:   02/27/22 1434  BP: 118/74  Pulse: 87  Weight: 173 lb (78.5 kg)  Height: '5\' 9"'$  (1.753 m)   Body mass index is 25.55 kg/m.  Generalized: Well developed, in no acute distress, well-groomed Neurological examination  Mentation: Alert, history is provided by his caregiver, speech is loud, somewhat abrupt, fast movements, follows most exam commands Cranial nerve II-XII: Pupils were equal round reactive to light. Extraocular movements were full, visual field were  full on confrontational test. Facial sensation and strength were normal. Head turning and shoulder shrug were normal and symmetric. Motor: The motor testing reveals 5 over 5 strength of all 4 extremities. Good symmetric motor tone is noted throughout.  Sensory: Sensory testing is intact to soft touch on all 4 extremities. No evidence of extinction is noted.  Coordination: Cerebellar testing reveals good finger-nose-finger and heel-to-shin bilaterally. No tremor noted.  Gait and station: Gait is normal walks fast  Reflexes: Deferred, he got off the table   DIAGNOSTIC DATA (LABS, IMAGING, TESTING) - I reviewed patient records, labs, notes, testing and imaging myself where available.  Lab Results  Component Value Date   WBC 7.1 09/14/2016   HGB 13.5 09/14/2016   HCT 39.1 (L) 09/14/2016   MCV 100.5 (H) 09/14/2016   PLT 265 09/14/2016      Component Value Date/Time   NA 140 09/14/2016 1611   NA 137 03/13/2013 2100   K 4.2 09/14/2016 1611   K 3.7 03/13/2013 2100   CL 105 09/14/2016 1611   CL 105 03/13/2013 2100   CO2 27 09/14/2016 1611   CO2 31 03/13/2013 2100   GLUCOSE 89 09/14/2016 1611   GLUCOSE 105 (H) 03/13/2013 2100   BUN 9 09/14/2016 1611   BUN 14 03/13/2013 2100   CREATININE 0.93 09/14/2016 1611   CREATININE 1.15 03/13/2013 2100   CALCIUM 9.3 09/14/2016 1611    CALCIUM 9.1 03/13/2013 2100   PROT 7.3 03/13/2013 2100   ALBUMIN 4.0 03/13/2013 2100   AST 35 03/13/2013 2100   ALT 27 03/13/2013 2100   ALKPHOS 68 03/13/2013 2100   BILITOT 0.2 03/13/2013 2100   GFRNONAA >60 09/14/2016 1611   GFRNONAA >60 03/13/2013 2100   GFRAA >60 09/14/2016 1611   GFRAA >60 03/13/2013 2100   No results found for: "CHOL", "HDL", "LDLCALC", "LDLDIRECT", "TRIG", "CHOLHDL" No results found for: "HGBA1C" No results found for: "VITAMINB12" No results found for: "TSH"  ASSESSMENT AND PLAN 51 y.o. year old male  has a past medical history of Anxiety disorder, Autism, BPH (benign prostatic hyperplasia), Dyslipidemia, Psychosis ([place]), and Seizures ([place]). here with:  1.  Seizures 2.  Autism  -Clarence appears to be at baseline for me today, I don't see any signs of  toxicity on exam -I would like to recheck his Dilantin level, was 33, 34 at recheck at PCP last week reportedly were trough levels -Not sure what would account for the swing in levels, his pills are given to him by staff and come in pill packs  -At our last visit his level was 3.2 in Dec 2022 -For today we will continue his Dilantin 450 mg daily, if level continues to be elevated consider dose reduction   Butler Denmark, AGNP-C, DNP 02/27/2022, 3:02 PM [facility] [address] [place], [place] [address] [phone]

## 2022-02-26 NOTE — Telephone Encounter (Signed)
I received Dilantin Level and it has been entered into epic:    Will fwd to NP.

## 2022-02-27 ENCOUNTER — Ambulatory Visit (INDEPENDENT_AMBULATORY_CARE_PROVIDER_SITE_OTHER): Payer: Medicare Other | Admitting: Neurology

## 2022-02-27 VITALS — BP 118/74 | HR 87 | Ht 69.0 in | Wt 173.0 lb

## 2022-02-27 DIAGNOSIS — R569 Unspecified convulsions: Secondary | ICD-10-CM | POA: Diagnosis not present

## 2022-02-27 DIAGNOSIS — G40309 Generalized idiopathic epilepsy and epileptic syndromes, not intractable, without status epilepticus: Secondary | ICD-10-CM

## 2022-03-01 ENCOUNTER — Telehealth: Payer: Self-pay | Admitting: Neurology

## 2022-03-01 ENCOUNTER — Telehealth: Payer: Self-pay

## 2022-03-01 DIAGNOSIS — R569 Unspecified convulsions: Secondary | ICD-10-CM

## 2022-03-01 LAB — PHENYTOIN LEVEL, TOTAL: Phenytoin (Dilantin), Serum: 44.2 ug/mL (ref 10.0–20.0)

## 2022-03-01 MED ORDER — DIVALPROEX SODIUM 500 MG PO DR TAB: 500.0000 mg | DELAYED_RELEASE_TABLET | Freq: Two times a day (BID) | ORAL | 11 refills | Status: DC

## 2022-03-01 MED ORDER — NAYZILAM 5 MG/0.1ML NA SOLN: NASAL | 0 refills | Status: DC

## 2022-03-01 NOTE — Telephone Encounter (Signed)
Patient's Dilantin level returned significantly elevated at 44.2.  Unclear explanation for sudden increase in Dilantin level.  His medications come in pill packs and are given by group home staff.  Discussed this with Dr. April Manson, will stop Dilantin, start Depakote 500 mg twice a day.  Return in 6 weeks to check labs. I sent in [place] as rescue for seizure if needed. I spoke with Adonis Huguenin, will fax orders over. Unclear what other seizure medications he has tried.   Meds ordered this encounter  Medications   Midazolam (NAYZILAM) 5 MG/0.1ML SOLN    Sig: Give 1 spray ('5mg'$ ) into one nostril, one additional spray can be given into the opposite nostril after 10 minutes if needed    Dispense:  3 each    Refill:  0    Dispense as 1 box containing 2 single dose nasal spray devices   divalproex (DEPAKOTE) 500 MG DR tablet    Sig: Take 1 tablet (500 mg total) by mouth 2 (two) times daily.    Dispense:  60 tablet    Refill:  11   Orders Placed This Encounter  Procedures   EEG adult

## 2022-03-01 NOTE — Telephone Encounter (Signed)
I called [facility] and spoke with Beverely Low, Dilantin specimen receivied on 12/19 and result in processing. He could not give me the specific time frame when the result would be ready but should be "soon".   Will follow this result today.

## 2022-03-13 ENCOUNTER — Telehealth: Payer: Self-pay | Admitting: Neurology

## 2022-03-13 MED ORDER — DIVALPROEX SODIUM ER 500 MG PO TB24: 500.0000 mg | ORAL_TABLET | Freq: Every day | ORAL | 3 refills | Status: DC

## 2022-03-13 NOTE — Addendum Note (Signed)
Addended by: Verlin Grills on: 03/13/2022 05:03 PM   Modules accepted: Orders

## 2022-03-13 NOTE — Telephone Encounter (Signed)
Vivian(pt's caretaker ) is asking for a call to discuss pt now being on the Depakote instead of the Dilantin .  Adonis Huguenin can be reached at [phone]

## 2022-03-13 NOTE — Telephone Encounter (Signed)
[name] called me back and we dicussed. She would prefer try Depakote 500 ER qhs.   Pt struggled with N/V from the beginning and she is adamant no to resume the 500 IR bid due to symptoms. Will have NP sign and fax tomorrow.

## 2022-03-13 NOTE — Telephone Encounter (Signed)
I called [name] back and left a vm for CB.

## 2022-03-13 NOTE — Telephone Encounter (Addendum)
RN spoke with caregiver, Adonis Huguenin, reportedly called on call MD 12/29 reported N/V with Depakote DR 500 mg twice daily, was told to reduce to 1 tablet at bedtime. If he is doing better, I will switch him to Depakote ER 500 mg at bedtime to provide longer coverage. It would be reasonable to retry the original Depakote 500 mg twice daily if other factors may have been contributing (GI issue, coming of Dilantin toxicity). If not felt the case, then we will proceed with ER preparation. EEG is 04/02/22. We check labs in 6 weeks (Depakote, CBC, CMP, Dilantin-to ensure not accidentally getting)

## 2022-03-13 NOTE — Telephone Encounter (Signed)
Spoke with sarah verbally on this she will open a new telephone on this and work from there.

## 2022-03-14 NOTE — Telephone Encounter (Signed)
Judson Roch has signed the order and I have faxed to # [phone], confirmation received.

## 2022-03-29 ENCOUNTER — Other Ambulatory Visit: Payer: Medicare Other | Admitting: *Deleted

## 2022-04-02 ENCOUNTER — Ambulatory Visit (INDEPENDENT_AMBULATORY_CARE_PROVIDER_SITE_OTHER): Payer: Medicare Other | Admitting: Neurology

## 2022-04-02 DIAGNOSIS — R569 Unspecified convulsions: Secondary | ICD-10-CM | POA: Diagnosis not present

## 2022-04-02 NOTE — Procedures (Signed)
    History:  52 year old man with seizure disorder   EEG classification: Awake and drowsy  Description of the recording: The background rhythms of this recording consists of a fairly well modulated medium amplitude alpha rhythm of 9 Hz that is reactive to eye opening and closure. Present in the anterior head region is a 15-20 Hz beta activity. Photic stimulation was performed, did not show any abnormalities. Hyperventilation was also performed, did not show any abnormalities. Drowsiness was manifested by background fragmentation. No abnormal epileptiform discharges seen during this recording. There was no focal slowing. There were no electrographic seizure identified.   Abnormality: None   Impression: This is a normal EEG recorded while drowsy and awake. No evidence of interictal epileptiform discharges. Normal EEGs, however, do not rule out epilepsy.    Alric Ran, MD [facility]

## 2022-04-03 ENCOUNTER — Telehealth: Payer: Self-pay | Admitting: Neurology

## 2022-04-03 DIAGNOSIS — R569 Unspecified convulsions: Secondary | ICD-10-CM

## 2022-04-03 NOTE — Telephone Encounter (Signed)
Please call his caregiver, EEG was normal.  I would like to check labs now that he is on Depakote.  Can you ask Adonis Huguenin to bring him in in the next few weeks to get a baseline.  Please also ask them to schedule in about 4 months to see Dr. April Manson, this visit can be to establish care.  Thanks  Orders Placed This Encounter  Procedures   CBC with Differential/Platelet   CMP   Valproic Acid Level   Phenytoin Level, Total   Impression: This is a normal EEG recorded while drowsy and awake. No evidence of interictal epileptiform discharges. Normal EEGs, however, do not rule out epilepsy.

## 2022-04-03 NOTE — Telephone Encounter (Signed)
Called and spoke to representative at [facility] where pt is a resident and received a fax number to sed results to at [phone]. will forward note from [facility]

## 2022-04-11 ENCOUNTER — Telehealth: Payer: Self-pay | Admitting: Neurology

## 2022-04-11 NOTE — Telephone Encounter (Signed)
Please ask his caregiver to bring him for labs. Orders are in. Thanks

## 2022-04-11 NOTE — Telephone Encounter (Signed)
Vivian's phone [phone],

## 2022-04-11 NOTE — Telephone Encounter (Signed)
I called vivan andd we discussed labs work that is needed. She requested orders to be sent to [facility]. I have sent as requested and she will completed by the end of the week.

## 2022-04-16 ENCOUNTER — Other Ambulatory Visit
Admission: RE | Admit: 2022-04-16 | Discharge: 2022-04-16 | Disposition: A | Discharge status: Home / Self Care | Payer: Medicare Other | Attending: Neurology | Admitting: Neurology

## 2022-04-16 DIAGNOSIS — R569 Unspecified convulsions: Secondary | ICD-10-CM | POA: Insufficient documentation

## 2022-04-16 LAB — COMPREHENSIVE METABOLIC PANEL
ALT: 18 U/L (ref 0–44)
AST: 20 U/L (ref 15–41)
Albumin: 4.5 g/dL (ref 3.5–5.0)
Alkaline Phosphatase: 71 U/L (ref 38–126)
Anion gap: 8 (ref 5–15)
BUN: 12 mg/dL (ref 6–20)
CO2: 29 mmol/L (ref 22–32)
Calcium: 9.3 mg/dL (ref 8.9–10.3)
Chloride: 105 mmol/L (ref 98–111)
Creatinine, Ser: 0.82 mg/dL (ref 0.61–1.24)
GFR, Estimated: >60 mL/min (ref >60)
Glucose, Bld: 103 mg/dL — ABNORMAL HIGH (ref 70–99)
Potassium: 3.9 mmol/L (ref 3.5–5.1)
Sodium: 142 mmol/L (ref 135–145)
Total Bilirubin: 0.9 mg/dL (ref 0.3–1.2)
Total Protein: 7.9 g/dL (ref 6.5–8.1)

## 2022-04-16 LAB — CBC WITH DIFFERENTIAL/PLATELET
Abs Immature Granulocytes: 0.01 10*3/uL (ref 0.00–0.07)
Basophils Absolute: 0 10*3/uL (ref 0.0–0.1)
Basophils Relative: 0 %
Eosinophils Absolute: 0.1 10*3/uL (ref 0.0–0.5)
Eosinophils Relative: 1 %
HCT: 41 % (ref 39.0–52.0)
Hemoglobin: 14 g/dL (ref 13.0–17.0)
Immature Granulocytes: 0 %
Lymphocytes Relative: 38 %
Lymphs Abs: 2.3 10*3/uL (ref 0.7–4.0)
MCH: 34.4 pg — ABNORMAL HIGH (ref 26.0–34.0)
MCHC: 34.1 g/dL (ref 30.0–36.0)
MCV: 100.7 fL — ABNORMAL HIGH (ref 80.0–100.0)
Monocytes Absolute: 0.6 10*3/uL (ref 0.1–1.0)
Monocytes Relative: 9 %
Neutro Abs: 3.1 10*3/uL (ref 1.7–7.7)
Neutrophils Relative %: 52 %
Platelets: 224 10*3/uL (ref 150–400)
RBC: 4.07 MIL/uL — ABNORMAL LOW (ref 4.22–5.81)
RDW: 11.9 % (ref 11.5–15.5)
WBC: 6.1 10*3/uL (ref 4.0–10.5)
nRBC: 0 % (ref 0.0–0.2)

## 2022-04-16 LAB — VALPROIC ACID LEVEL: Valproic Acid Lvl: 45 ug/mL — ABNORMAL LOW (ref 50.0–100.0)

## 2022-04-16 LAB — PHENYTOIN LEVEL, TOTAL: Phenytoin Lvl: <2.5 ug/mL — ABNORMAL LOW (ref 10.0–20.0)

## 2022-04-16 NOTE — Addendum Note (Signed)
Addended by: Antonieta Iba C on: 04/16/2022 12:59 PM   Modules accepted: Orders

## 2022-04-17 ENCOUNTER — Telehealth: Payer: Self-pay

## 2022-04-17 NOTE — Telephone Encounter (Signed)
I called patient's brother, per DPR, to discuss. No answer, no VM set up. Sent [employer].

## 2022-04-17 NOTE — Telephone Encounter (Signed)
-----   Message from Suzzanne Cloud, NP sent at 04/16/2022  3:24 PM EST ----- Please call caregiver, labs look good on Depakote. Depakote level is 45, slightly low but okay to continue at current dosing. Let us know if he has any seizures. I only checked Dilantin level to ensure he was no longer taking it. Thanks

## 2022-05-20 ENCOUNTER — Emergency Department
Admission: EM | Admit: 2022-05-20 | Discharge: 2022-05-20 | Disposition: A | Discharge status: Home / Self Care | Payer: Medicare Other | Attending: Emergency Medicine | Admitting: Emergency Medicine

## 2022-05-20 DIAGNOSIS — F840 Autistic disorder: Secondary | ICD-10-CM | POA: Diagnosis not present

## 2022-05-20 DIAGNOSIS — T465X1A Poisoning by other antihypertensive drugs, accidental (unintentional), initial encounter: Secondary | ICD-10-CM | POA: Insufficient documentation

## 2022-05-20 DIAGNOSIS — T50901A Poisoning by unspecified drugs, medicaments and biological substances, accidental (unintentional), initial encounter: Secondary | ICD-10-CM

## 2022-05-20 LAB — CBC WITH DIFFERENTIAL/PLATELET
Abs Immature Granulocytes: 0.01 10*3/uL (ref 0.00–0.07)
Basophils Absolute: 0 10*3/uL (ref 0.0–0.1)
Basophils Relative: 0 %
Eosinophils Absolute: 0.1 10*3/uL (ref 0.0–0.5)
Eosinophils Relative: 1 %
HCT: 38.2 % — ABNORMAL LOW (ref 39.0–52.0)
Hemoglobin: 13 g/dL (ref 13.0–17.0)
Immature Granulocytes: 0 %
Lymphocytes Relative: 34 %
Lymphs Abs: 2.1 10*3/uL (ref 0.7–4.0)
MCH: 34.6 pg — ABNORMAL HIGH (ref 26.0–34.0)
MCHC: 34 g/dL (ref 30.0–36.0)
MCV: 101.6 fL — ABNORMAL HIGH (ref 80.0–100.0)
Monocytes Absolute: 0.5 10*3/uL (ref 0.1–1.0)
Monocytes Relative: 8 %
Neutro Abs: 3.5 10*3/uL (ref 1.7–7.7)
Neutrophils Relative %: 57 %
Platelets: 195 10*3/uL (ref 150–400)
RBC: 3.76 MIL/uL — ABNORMAL LOW (ref 4.22–5.81)
RDW: 11.6 % (ref 11.5–15.5)
WBC: 6.2 10*3/uL (ref 4.0–10.5)
nRBC: 0 % (ref 0.0–0.2)

## 2022-05-20 LAB — COMPREHENSIVE METABOLIC PANEL
ALT: 21 U/L (ref 0–44)
AST: 20 U/L (ref 15–41)
Albumin: 3.9 g/dL (ref 3.5–5.0)
Alkaline Phosphatase: 59 U/L (ref 38–126)
Anion gap: 4 — ABNORMAL LOW (ref 5–15)
BUN: 10 mg/dL (ref 6–20)
CO2: 28 mmol/L (ref 22–32)
Calcium: 8.5 mg/dL — ABNORMAL LOW (ref 8.9–10.3)
Chloride: 109 mmol/L (ref 98–111)
Creatinine, Ser: 0.8 mg/dL (ref 0.61–1.24)
GFR, Estimated: >60 mL/min (ref >60)
Glucose, Bld: 65 mg/dL — ABNORMAL LOW (ref 70–99)
Potassium: 4.1 mmol/L (ref 3.5–5.1)
Sodium: 141 mmol/L (ref 135–145)
Total Bilirubin: 0.7 mg/dL (ref 0.3–1.2)
Total Protein: 6.8 g/dL (ref 6.5–8.1)

## 2022-05-20 MED ORDER — LACTATED RINGERS IV BOLUS
1000.0000 mL | Freq: Once | INTRAVENOUS | Status: AC
  Administered 2022-05-20: 1000 mL via INTRAVENOUS

## 2022-05-20 NOTE — ED Notes (Signed)
Pt given soda, bg 65.

## 2022-05-20 NOTE — ED Notes (Signed)
Pt resting comfortably at this time. Pt denies any needs or concerns.

## 2022-05-20 NOTE — Discharge Instructions (Signed)
Take all medications as prescribed and follow-up with your primary doctor this week.

## 2022-05-20 NOTE — ED Triage Notes (Signed)
Pt presents to the ED via POV with house manager due to medication overdose. Pt was given the wrong medication. The house manager states they were concerned about "clonidine" because patient has a patch and a was given clonidine pill. Patch was removed in triage by this RN. Pt brought back to room.

## 2022-05-20 NOTE — ED Notes (Signed)
Orthostatic vitals done at this time.

## 2022-05-20 NOTE — ED Notes (Signed)
Attempted to call legal guardian listed in chart, line went straight to voicemail.

## 2022-05-20 NOTE — ED Provider Notes (Signed)
[facility] Provider Note    Event Date/Time   First MD Initiated Contact with Patient 05/20/22 1006     (approximate)   History   Drug Overdose   HPI  [name] is a 52 y.o. male   Past medical history of autism, anxiety, psychosis and seizures who presents to the emergency department with inadvertently being administered another patient's medications from his group home.  He is here with his group Games developer.  Approximately 8 to 9 am medications administered and quickly realized by facility brought to the emergency department -patient is asymptomatic and behaving normally.  He was found to be hypotensive in the emergency department triage.  He was administered an additional clonidine in addition to his clonidine patch which was already in place.  This was quickly removed via triage staff.  His blood pressure got better.   Call placed to poison control to review all of the medications that he inadvertently received and recommendation was for observation for 8 hours postingestion with monitoring of hemodynamics and dopamine as needed.  Independent Historian contributed to assessment above: group home staff       Physical Exam   Triage Vital Signs: ED Triage Vitals  Enc Vitals Group     BP 05/20/22 0959 (!) 76/66     Pulse Rate 05/20/22 0954 70     Resp 05/20/22 0954 18     Temp 05/20/22 0954 98.2 F (36.8 C)     Temp Source 05/20/22 0954 Oral     SpO2 05/20/22 0954 99 %     Weight 05/20/22 1005 173 lb 1 oz (78.5 kg)     Height 05/20/22 1005 '5\' 9"'$  (1.753 m)     Head Circumference --      Peak Flow --      Pain Score 05/20/22 1005 0     Pain Loc --      Pain Edu? --      Excl. in [place]? --     Most recent vital signs: Vitals:   05/20/22 0959 05/20/22 1028  BP: (!) 76/66 107/66  Pulse:  (!) 56  Resp:  18  Temp:    SpO2:  99%    General: Awake, no distress.  CV:  Good peripheral perfusion.  Resp:  Normal effort.  Abd:  No  distention.  Other:  Awake alert oriented comfortable appearing initially hypotensive 70s over 60s but recheck after removing clonidine patch resolved to 100 over 60s and his heart rate remains in the 50s normal sinus rhythm moving all extremities skin appears warm well-perfused mentation at baseline per staff member who is at bedside.   ED Results / Procedures / Treatments   Labs (all labs ordered are listed, but only abnormal results are displayed) Labs Reviewed  CBC WITH DIFFERENTIAL/PLATELET - Abnormal; Notable for the following components:      Result Value   RBC 3.76 (*)    HCT 38.2 (*)    MCV 101.6 (*)    MCH 34.6 (*)    All other components within normal limits  COMPREHENSIVE METABOLIC PANEL - Abnormal; Notable for the following components:   Glucose, Bld 65 (*)    Calcium 8.5 (*)    Anion gap 4 (*)    All other components within normal limits     I ordered and reviewed the above labs they are notable for glucose is slightly low at 65, electrolytes within normal limits otherwise  EKG  ED ECG  REPORT I, Lucillie Garfinkel, the attending physician, personally viewed and interpreted this ECG.   Date: 05/20/2022  EKG Time: 1012  Rate: 63  Rhythm: nsr  Axis: nl  Intervals:none  ST&T Change: No acute ischemic changes or malignant dysrhythmia noted    PROCEDURES:  Critical Care performed: Yes, see critical care procedure note(s)  .Critical Care  Performed by: Lucillie Garfinkel, MD Authorized by: Lucillie Garfinkel, MD   Critical care provider statement:    Critical care time (minutes):  30   Critical care was time spent personally by me on the following activities:  Development of treatment plan with patient or surrogate, discussions with consultants, evaluation of patient's response to treatment, examination of patient, ordering and review of laboratory studies, ordering and review of radiographic studies, ordering and performing treatments and interventions, pulse oximetry,  re-evaluation of patient's condition and review of old charts    [facility] ED: Medications  lactated ringers bolus 1,000 mL (0 mLs Intravenous Stopped 05/20/22 1128)     IMPRESSION / MDM / [place] / ED COURSE  I reviewed the triage vital signs and the nursing notes.                                Patient's presentation is most consistent with acute presentation with potential threat to life or bodily function.  Differential diagnosis includes, but is not limited to, drug overdose, metabolic derangements, dysrhythmia   The patient is on the cardiac monitor to evaluate for evidence of arrhythmia and/or significant heart rate changes.  MDM: This is a patient who was inadvertently administered another patient's medications most notably an additional dose of clonidine for which she already has a clonidine plan patch in place and exhibited transient but resolved hypotension as a result.  Poison control contacted and reviewed all medications that were administered (see below) as well as the patient's normal home medications which were not administered and recommendation is for 8-hour observation with hemodynamic monitoring and dopamine as needed for persistent hypotension or bradycardia.  Patient is stable.   Inadvertently administered medications include sodium chloride tabs, fenofibrate, vitamin B12, Fiber-Lax, carbamazepine, Keppra, and clonidine        FINAL CLINICAL IMPRESSION(S) / ED DIAGNOSES   Final diagnoses:  Accidental overdose, initial encounter     Rx / DC Orders   ED Discharge Orders     None        Note:  This document was prepared using Dragon voice recognition software and may include unintentional dictation errors.    Lucillie Garfinkel, MD 05/20/22 [phone]

## 2022-05-20 NOTE — ED Notes (Signed)
RN assisted pt with using urinal. Pt resting comfortably in bed at this time.

## 2022-05-20 NOTE — ED Notes (Signed)
Pt discharge to home. Pt VSS, GCS 15, NAD. Pt verbalized understanding of discharge instructions with no additional questions at this time.  

## 2022-05-20 NOTE — ED Notes (Addendum)
Care giver from home states to give call when pt is up for dc and she will come pick him up. Phone number is [phone].

## 2022-05-20 NOTE — ED Notes (Signed)
RN called poison control to begin case. Per Lenda Kelp with poison control states to obtain EKG, give fluids, observe pt for 8 hrs after time of ingestion (being 9am). Lenda Kelp states if pt becomes symptomatic with bradycardia can try dopamine but continue to stimulate patient by waking him up and talking to him. MD Jacelyn Grip notified of poison control recommendations.

## 2022-05-20 NOTE — ED Notes (Signed)
RN called supervisor at [facility] group home to notify about pt discharge. States someone will be here to pick him up.

## 2022-05-20 NOTE — ED Notes (Signed)
Pt given sandwich tray and soda. Pt denies any additional needs at this time.

## 2022-07-02 ENCOUNTER — Ambulatory Visit (INDEPENDENT_AMBULATORY_CARE_PROVIDER_SITE_OTHER): Payer: Medicare Other | Admitting: Neurology

## 2022-07-02 ENCOUNTER — Encounter: Payer: Self-pay | Admitting: Neurology

## 2022-07-02 VITALS — BP 127/74 | HR 89 | Ht 68.0 in | Wt 156.5 lb

## 2022-07-02 DIAGNOSIS — G40309 Generalized idiopathic epilepsy and epileptic syndromes, not intractable, without status epilepticus: Secondary | ICD-10-CM

## 2022-07-02 MED ORDER — DIVALPROEX SODIUM ER 500 MG PO TB24: 500.0000 mg | ORAL_TABLET | Freq: Every day | ORAL | 3 refills | Status: DC

## 2022-07-02 MED ORDER — DIVALPROEX SODIUM ER 250 MG PO TB24: 250.0000 mg | ORAL_TABLET | Freq: Every day | ORAL | 3 refills | Status: DC

## 2022-07-02 NOTE — Patient Instructions (Addendum)
-  Since you had a breakthrough seizure and your depakote level was low, I will increase it to Depakote  XR 750 mg daily   -Continue with your other medications  -Follow up with Maralyn Sago in 1 year or sooner if worse

## 2022-07-02 NOTE — Progress Notes (Signed)
PATIENT: [name] DOB: [birth year]  REASON FOR VISIT: follow up HISTORY FROM: patient/Caregiver  Primary Neurologist: Aarav Burgett  HISTORY OF PRESENT ILLNESS: Today 07/02/22 [name] presents today for follow-up, he is accompanied by caregiver.  At last visit in December plan was to switch Dilantin to Depakote.  He was initially put on Depakote 500 mg twice daily but did have side effects.  Depakote was reduced to 500 mg daily and since then he has been doing well except on February 12 when he had a breakthrough seizure described as staring spell and being unresponsive.  Since then he has been doing well on Depakote 500 mg daily.  Denies any side effect.  No other complaints, his latest Depakote level was at 45.   Update 12/19/023 SS:  Here today for follow up, had dilantin level drawn at PCP 02/22/22 34.1, 02/23/22 33.3. these were trough levels reportedly. Takes Dilantin 450 mg daily in the AM. Denies any symptoms, but has been putting his shirt on backwards for a few months, after mentioning symptoms caregiver reports potential shakiness in hands. No falls. Behavior is the same, is loud. Last seizure was several years ago. No new medications or changes to account for the swing.  Update 02/22/21 SS: [name] is here today for follow-up. No recent seizures. Remains on brand name Dilantin, tolerating well. Lives in group home. Reviewed labs from PCP Sept 2022 CMP was normal, TSH was 4.023, CBC okay except mild increased MCV, MCH, HGB was 14.1. No issues today. Here today with caretaker, Maureen Ralphs.   Update 02/03/2020 SS:[name] is a 52 year old male with history of autism and seizure disorder.  Seizures remain well controlled, last was in July 2018.  He is on brand-name Dilantin, tolerating well.  He lives in a group home.  In February 2020, his Dilantin level was low at 8.3, dose was increased 450 mg daily from 350 mg daily.  Recent Dilantin level 01/12/20 was 13.5. CBC, CMP reviewed. Doing well,  overall, health is stable, no dizziness or falls. Has 4 other housemates, he gets along well.  No seizures.  Presents today for evaluation accompanied by his caregiver, Chip Boer. Sees PCP Dr. Marcello Fennel routinely.   HISTORY 12/23/2018 SS: [name] is a 52 year old male with history of autism and seizure disorder.  He was last seen in February 2020 for a dose adjustment of Dilantin, after his Dilantin level was found to be low at 8.3.  Following his office visit, his dose was increased to 450 mg daily, from 350 mg daily.  His last seizure occurred September 14, 2016.  He remains on brand-name Dilantin.  He had a recent Dilantin level checked 12/18/2018 by his primary care doctor that resulted elevated at 21.  This level was not a trough level.  He is here today with his caregiver from the group home, Chip Boer.  He has not had any recent problems.  He has been doing well.  He has not had any falls.  The group home manages and administers his medications.  His primary care doctor is Dr. [facility]cello [facility]ennel at [facility].  He presents today for follow-up accompanied by caregiver.   REVIEW OF SYSTEMS: Out of a complete 14 system review of symptoms, the patient complains only of the following symptoms, and all other reviewed systems are negative.  Seizures  ALLERGIES: No Known Allergies  HOME MEDICATIONS: Outpatient Medications Prior to Visit  Medication Sig Dispense Refill   atorvastatin (LIPITOR) 20 MG tablet Take 20 mg by mouth  daily.     cetirizine (ZYRTEC) 10 MG tablet Take by mouth.     chlorproMAZINE (THORAZINE) 50 MG tablet Take 50 mg by mouth 3 (three) times daily as needed.     cholecalciferol (VITAMIN D3) 25 MCG (1000 UNIT) tablet Take 1,000 Units by mouth daily.     cloNIDine (CATAPRES - DOSED IN MG/24 HR) 0.2 mg/24hr patch APPLY ONE PATCH ONTO THE SKIN ONCE A WEEK. *REMOVE OLD PATCH* *CHECK BP Q WEEK* (HYPERTENSION)     fluticasone (FLONASE) 50 MCG/ACT nasal spray Place 2 sprays into both nostrils daily.      ketoconazole (NIZORAL) 2 % shampoo Apply 1 Application topically once.     levocetirizine (XYZAL) 5 MG tablet Take 5 mg by mouth every evening.     LORazepam (ATIVAN) 0.5 MG tablet Take 0.5 mg by mouth daily.     LORazepam (ATIVAN) 1 MG tablet Take 0.5 mg by mouth daily at 2 PM.     Melatonin 5 MG CAPS Take 1 capsule by mouth at bedtime.     Midazolam (NAYZILAM) 5 MG/0.1ML SOLN Give 1 spray ( ) into one nostril, one additional spray can be given into the opposite nostril after 10 minutes if needed 3 each 0   mometasone (ELOCON) 0.1 % cream Apply 1 application topically daily.     Omega 3 1000 MG CAPS Take 1 capsule by mouth daily.     ondansetron (ZOFRAN-ODT) 4 MG disintegrating tablet TAKE ONE TABLET BY MOUTH EVERY 8 HOURS AS NEEDED FOR NAUSEA     polyethylene glycol (MIRALAX / GLYCOLAX) packet Mix one packet in 4 ounces of water on Tuesday and Thursday for constipation.     psyllium (METAMUCIL SMOOTH TEXTURE) 58.6 % powder MIX 1 PACKET AS DIRECTED & TAKE BY MOUTH TWICE DAILY     risperiDONE (RISPERDAL) 0.25 MG tablet      risperiDONE (RISPERDAL) 0.5 MG tablet Take 0.75 mg by mouth 3 (three) times daily. 1.5 tabs po TID     Sodium Fluoride (PREVIDENT 5000 PLUS DT) Place 1 Dose onto teeth as needed.     SUMAtriptan (IMITREX) 25 MG tablet Take by mouth.     triamcinolone lotion (KENALOG) 0.1 % Apply 1 application topically daily.     divalproex (DEPAKOTE ER) 500 MG 24 hr tablet Take 1 tablet (500 mg total) by mouth at bedtime. 30 tablet 3   acetaminophen (TYLENOL) 650 MG CR tablet Take 650 mg by mouth every 8 (eight) hours as needed for pain.     benzonatate (TESSALON) 200 MG capsule Take 200 mg by mouth 3 (three) times daily as needed for cough.     chlorhexidine (PERIDEX) 0.12 % solution      cloNIDine (CATAPRES - DOSED IN MG/24 HR) 0.2 mg/24hr patch Place 1 patch onto the skin once a week.     hydrocortisone 2.5 % cream Place rectally.     LORazepam (ATIVAN) 1 MG tablet Take 1 mg by  mouth daily.     No facility-administered medications prior to visit.    PAST MEDICAL HISTORY: Past Medical History:  Diagnosis Date   Anxiety disorder    Autism    BPH (benign prostatic hyperplasia)    Dyslipidemia    Psychosis    Seizures    most recent sz 09/14/16    PAST SURGICAL HISTORY: Past Surgical History:  Procedure Laterality Date   COLONOSCOPY N/A 01/25/2021   Procedure: COLONOSCOPY;  Surgeon: Toledo, Boykin Nearing, MD;  Location: [facility] ENDOSCOPY;  Service: Gastroenterology;  Laterality: N/A;    FAMILY HISTORY: History reviewed. No pertinent family history.  SOCIAL HISTORY: Social History   Socioeconomic History   Marital status: Single    Spouse name: Not on file   Number of children: 0   Years of education: Not on file   Highest education level: Not on file  Occupational History   Not on file  Tobacco Use   Smoking status: Never   Smokeless tobacco: Never  Vaping Use   Vaping Use: Never used  Substance and Sexual Activity   Alcohol use: No   Drug use: No   Sexual activity: Not Currently  Other Topics Concern   Not on file  Social History Narrative   Not on file   Social Determinants of Health   Financial Resource Strain: Not on file  Food Insecurity: Not on file  Transportation Needs: Not on file  Physical Activity: Not on file  Stress: Not on file  Social Connections: Not on file  Intimate Partner Violence: Not on file   PHYSICAL EXAM  Vitals:   07/02/22 1328  BP: 127/74  Pulse: 89  Weight: 156 lb 8 oz (71 kg)  Height: 5\' 8"  (1.727 m)   Body mass index is 23.8 kg/m.  Generalized: Well developed, in no acute distress, well-groomed Neurological examination  Mentation: Alert, history is provided by his caregiver, speech is loud, somewhat abrupt, fast movements, follows most exam commands Cranial nerve II-XII: Pupils were equal round reactive to light. Extraocular movements were full, visual field were full on confrontational test.  Facial sensation and strength were normal. Head turning and shoulder shrug were normal and symmetric. Motor: The motor testing reveals 5 over 5 strength of all 4 extremities. Good symmetric motor tone is noted throughout.  Sensory: Sensory testing is intact to soft touch on all 4 extremities. No evidence of extinction is noted.  Coordination: Cerebellar testing reveals good finger-nose-finger and heel-to-shin bilaterally. No tremor noted.  Gait and station: Gait is normal walks fast  Reflexes: Deferred, he got off the table   DIAGNOSTIC DATA (LABS, IMAGING, TESTING) - I reviewed patient records, labs, notes, testing and imaging myself where available.  Lab Results  Component Value Date   WBC 6.2 05/20/2022   HGB 13.0 05/20/2022   HCT 38.2 (L) 05/20/2022   MCV 101.6 (H) 05/20/2022   PLT 195 05/20/2022      Component Value Date/Time   NA 141 05/20/2022 1005   NA 137 03/13/2013 2100   K 4.1 05/20/2022 1005   K 3.7 03/13/2013 2100   CL 109 05/20/2022 1005   CL 105 03/13/2013 2100   CO2 28 05/20/2022 1005   CO2 31 03/13/2013 2100   GLUCOSE 65 (L) 05/20/2022 1005   GLUCOSE 105 (H) 03/13/2013 2100   BUN 10 05/20/2022 1005   BUN 14 03/13/2013 2100   CREATININE 0.80 05/20/2022 1005   CREATININE 1.15 03/13/2013 2100   CALCIUM 8.5 (L) 05/20/2022 1005   CALCIUM 9.1 03/13/2013 2100   PROT 6.8 05/20/2022 1005   PROT 7.3 03/13/2013 2100   ALBUMIN 3.9 05/20/2022 1005   ALBUMIN 4.0 03/13/2013 2100   AST 20 05/20/2022 1005   AST 35 03/13/2013 2100   ALT 21 05/20/2022 1005   ALT 27 03/13/2013 2100   ALKPHOS 59 05/20/2022 1005   ALKPHOS 68 03/13/2013 2100   BILITOT 0.7 05/20/2022 1005   BILITOT 0.2 03/13/2013 2100   GFRNONAA >60 05/20/2022 1005   GFRNONAA >60 03/13/2013 2100   GFRAA >60  09/14/2016 1611   GFRAA >60 03/13/2013 2100   No results found for: "CHOL", "HDL", "LDLCALC", "LDLDIRECT", "TRIG", "CHOLHDL" No results found for: "HGBA1C" No results found for: "VITAMINB12" No  results found for: "TSH"  ASSESSMENT AND PLAN 52 y.o. year old male  has a past medical history of Anxiety disorder, Autism, BPH (benign prostatic hyperplasia), Dyslipidemia, Psychosis, and Seizures. here with:  1.  Seizures 2.  Autism  -Since you had a breakthrough seizure and your depakote level was low, will increase it to Depakote  XR 750 mg daily   -Continue with your other medications  -Follow up with Maralyn Sago in 1 year or sooner if worse     Windell Norfolk, MD 07/02/2022, 6:06 PM [facility] [address] [place], [place] [address] [phone]

## 2022-07-17 ENCOUNTER — Telehealth: Payer: Self-pay | Admitting: Neurology

## 2022-07-17 NOTE — Telephone Encounter (Signed)
[name] called stated pt is shaking. Stated Dr. Teresa Coombs wants to see pt if he start shaking. She also wants to know if pt need lab work

## 2022-07-17 NOTE — Telephone Encounter (Signed)
Attempted to call a [name], no answer.

## 2022-07-17 NOTE — Telephone Encounter (Signed)
Call back to [place] Evert, no answer. Voicemail left to call office.

## 2022-08-17 ENCOUNTER — Other Ambulatory Visit
Admission: RE | Admit: 2022-08-17 | Discharge: 2022-08-17 | Disposition: A | Discharge status: Home / Self Care | Payer: Medicare Other | Source: Ambulatory Visit | Attending: Internal Medicine | Admitting: Internal Medicine

## 2022-08-17 DIAGNOSIS — J309 Allergic rhinitis, unspecified: Secondary | ICD-10-CM | POA: Diagnosis present

## 2022-08-17 DIAGNOSIS — F419 Anxiety disorder, unspecified: Secondary | ICD-10-CM | POA: Insufficient documentation

## 2022-08-17 DIAGNOSIS — R7889 Finding of other specified substances, not normally found in blood: Secondary | ICD-10-CM | POA: Diagnosis present

## 2022-08-17 DIAGNOSIS — E782 Mixed hyperlipidemia: Secondary | ICD-10-CM | POA: Diagnosis present

## 2022-08-17 DIAGNOSIS — G40909 Epilepsy, unspecified, not intractable, without status epilepticus: Secondary | ICD-10-CM | POA: Insufficient documentation

## 2022-08-17 LAB — PHENYTOIN LEVEL, TOTAL: Phenytoin Lvl: <2.5 ug/mL — ABNORMAL LOW (ref 10.0–20.0)

## 2022-09-05 ENCOUNTER — Other Ambulatory Visit: Payer: Self-pay

## 2022-09-05 ENCOUNTER — Emergency Department
Admission: EM | Admit: 2022-09-05 | Discharge: 2022-09-05 | Disposition: A | Discharge status: Home / Self Care | Payer: Medicare Other | Attending: Emergency Medicine | Admitting: Emergency Medicine

## 2022-09-05 DIAGNOSIS — T421X5A Adverse effect of iminostilbenes, initial encounter: Secondary | ICD-10-CM | POA: Insufficient documentation

## 2022-09-05 DIAGNOSIS — I952 Hypotension due to drugs: Secondary | ICD-10-CM | POA: Diagnosis not present

## 2022-09-05 DIAGNOSIS — I959 Hypotension, unspecified: Secondary | ICD-10-CM | POA: Diagnosis present

## 2022-09-05 MED ORDER — SODIUM CHLORIDE 0.9 % IV BOLUS
500.0000 mL | Freq: Once | INTRAVENOUS | Status: AC
  Administered 2022-09-05: 500 mL via INTRAVENOUS

## 2022-09-05 NOTE — Discharge Instructions (Signed)
[name] should take his normal evening medications tonight.  All of the normal daily and morning medications can be restarted tomorrow.  He should have a new clonidine patch placed tomorrow.    Return to the ER for new, worsening, or recurrent low blood pressure readings, any weakness or lightheadedness, chest pain, difficulty breathing, palpitations, or any other new or worsening symptoms that are concerning.

## 2022-09-05 NOTE — ED Notes (Signed)
Patient Alert and carrying on conversation with Home personnel. Denies being dizzy.

## 2022-09-05 NOTE — ED Notes (Signed)
This RN spoke with Raynelle Fanning at poison control, no concern requiring blood work or EKG at this point, asks that we determine if the other pt's clonidine was short acting tablet or long acting to determine when the pt may resume his regular clonidine patch

## 2022-09-05 NOTE — ED Triage Notes (Addendum)
Pt arrives via ems from his group home, pt was given another resident's medication this am, pt sent out to be evaluated pt was wearing a clonidine patch that ems removed  pt was given:  2 carbamazepin  1 clonidine tab 0.1mg  1 fenofibrate tab 145 Fiber lax Levetiracetam 750mg  1 lorazepam 1mg  1 sodium chloride tab 1 gm 1 vitamin b12 tab 500mg    Group home staff with pt and she notified the pt's guardian

## 2022-09-05 NOTE — ED Notes (Signed)
Called the Brother who is listed as his guardian Randal. He was made aware of visit and that patient was being discharged back to the home.

## 2022-09-05 NOTE — ED Provider Notes (Signed)
[facility] Provider Note    Event Date/Time   First MD Initiated Contact with Patient 09/05/22 1128     (approximate)   History   Hypotension   HPI  [name] is a 52 y.o. male with a history of autism, anxiety, depression, hypertension, hyperlipidemia, seizure disorder, and migraine who presents after he was accidentally given another patient's medication around 6 AM today.  He received the following medication:   2 carbamazepine  1 clonidine 0.1 mg  1 fenofibrate 1 fiber-lax 1 levetiracetam 750mg  1 lorazepam 1 mg, 1 sodium chloride 1 g 1 vitamin B12 500 mg  The patient also is on a clonidine patch for his blood pressure control.  This was removed once it was realized that he had gotten the other medication.  The patient himself denies any symptoms currently.  He states he does not feel dizzy or lightheaded, denies any acute pain, difficulty breathing, palpitations, weakness, or nausea.  I reviewed the past medical records.  The patient's most recent outpatient encounter was with internal medicine on 6/14 for follow-up of his chronic conditions.  He has no recent ED visits or admissions.   Physical Exam   Triage Vital Signs: ED Triage Vitals  Enc Vitals Group     BP 09/05/22 0824 94/68     Pulse Rate 09/05/22 0824 64     Resp 09/05/22 0824 16     Temp 09/05/22 0824 97.8 F (36.6 C)     Temp Source 09/05/22 0824 Oral     SpO2 09/05/22 0824 97 %     Weight 09/05/22 0825 150 lb (68 kg)     Height 09/05/22 0825 5\' 7"  (1.702 m)     Head Circumference --      Peak Flow --      Pain Score 09/05/22 0825 0     Pain Loc --      Pain Edu? --      Excl. in GC? --     Most recent vital signs: Vitals:   09/05/22 1204 09/05/22 1323  BP: 96/68 100/63  Pulse: 76 75  Resp: 18 18  Temp:  97.8 F (36.6 C)  SpO2: 98% 100%     General: Awake, no distress.  CV:  Good peripheral perfusion.  Resp:  Normal effort.  Abd:  No distention.   Other:  No peripheral edema.  Motor intact in all extremities.   ED Results / Procedures / Treatments   Labs (all labs ordered are listed, but only abnormal results are displayed) Labs Reviewed - No data to display   EKG  ED ECG REPORT I, Dionne Bucy, the attending physician, personally viewed and interpreted this ECG.  Date: 09/05/2022 EKG Time: 0826 Rate: 68 Rhythm: normal sinus rhythm QRS Axis: normal Intervals: normal ST/T Wave abnormalities: normal Narrative Interpretation: no evidence of acute ischemia    RADIOLOGY    PROCEDURES:  Critical Care performed: No  Procedures   MEDICATIONS ORDERED IN ED: Medications  sodium chloride 0.9 % bolus 500 mL (0 mLs Intravenous Stopped 09/05/22 1235)  sodium chloride 0.9 % bolus 500 mL (0 mLs Intravenous Stopped 09/05/22 1325)     IMPRESSION / MDM / ASSESSMENT AND PLAN / ED COURSE  I reviewed the triage vital signs and the nursing notes.  52 year old male with PMH as noted above presents from his group home after he was accidentally given medication intended for another patient.  He is hypotensive with otherwise normal vital signs.  The patient is asymptomatic.  Exam is otherwise unremarkable.  Differential diagnosis includes, but is not limited to, hypotension due to medication.  Poison control was consulted and the case was discussed with them.  They do not have any specific recommendations other than supportive care.  The clonidine tablet the patient received with short acting.  Since the patient's blood pressures persistently low we will give some fluids and observe him for 1 to 2 hours until it normalizes.  Patient's presentation is most consistent with acute presentation with potential threat to life or bodily function.  ----------------------------------------- 1:35 PM on 09/05/2022 -----------------------------------------  Blood pressure has significantly improved.  The patient remains asymptomatic.   He is stable for discharge at this time.  I have instructed his caregiver to resume his clonidine patch tomorrow, and he can take his normal evening medications tonight.  Return precautions have been provided, and the caregiver expresses understanding.  FINAL CLINICAL IMPRESSION(S) / ED DIAGNOSES   Final diagnoses:  Hypotension due to drugs     Rx / DC Orders   ED Discharge Orders     None        Note:  This document was prepared using Dragon voice recognition software and may include unintentional dictation errors.    Dionne Bucy, MD 09/05/22 1335

## 2022-09-05 NOTE — ED Notes (Signed)
IV bolus of 1000cc of NS completed. Patient denies any pain or dizziness. Sitter from home with patient

## 2022-09-12 ENCOUNTER — Telehealth: Payer: Self-pay

## 2022-09-12 DIAGNOSIS — G40309 Generalized idiopathic epilepsy and epileptic syndromes, not intractable, without status epilepticus: Secondary | ICD-10-CM

## 2022-09-12 DIAGNOSIS — R569 Unspecified convulsions: Secondary | ICD-10-CM

## 2022-09-12 NOTE — Telephone Encounter (Signed)
Call from [name] [phone]), patients caregiver, not listed on DPR, but in the process of becoming dual legal guardian with patient brother. [name] added [name] [phone]) to call. Advised I would see if new DPR could be faxed to [phone] at facility patient lives at.  [name] and [name] state tremors in hands are worsening and stuttering is worsening as well.  They also states that RN at facility requests a depakote level check. Advised I would send to covering provider for advised and recommendations and follow back up. Both appreciative of call.  Discussed with Maralyn Sago, NP in Dr. Teresa Coombs absence and history with patient, Maralyn Sago suggests tremors could be from starting Depakote. Tremors are not new but seem to be a little worse. Verbal order given by Margie Ege to have Depakote checked. Maralyn Sago also spoke with [name] about patients symptoms and recent ER visits for medication administration errors.   I spoke with [name] again and gave her location of [facility] on [address]. She was appreciative.

## 2022-09-17 ENCOUNTER — Other Ambulatory Visit: Payer: Self-pay

## 2022-09-17 NOTE — Telephone Encounter (Signed)
Pt's caregiver, Voncille Lo called, pt went to [facility] today for lab work in [place] and they could not find the order. Would like a call back. Contact info:[phone].

## 2022-09-17 NOTE — Telephone Encounter (Signed)
Returned call to vivian and it looked like the order needed to be released so I went under order review and released them so they are viewable at [facility].  Faxed to [phone]: [phone]

## 2022-09-28 LAB — VALPROIC ACID LEVEL: Valproic Acid Lvl: 68 ug/mL (ref 50–100)

## 2022-10-23 ENCOUNTER — Encounter: Payer: Self-pay | Admitting: Neurology

## 2022-12-10 ENCOUNTER — Encounter: Payer: Self-pay | Admitting: Neurology

## 2022-12-10 ENCOUNTER — Ambulatory Visit (INDEPENDENT_AMBULATORY_CARE_PROVIDER_SITE_OTHER): Payer: Medicare Other | Admitting: Neurology

## 2022-12-10 VITALS — BP 106/73 | HR 85 | Ht 67.0 in | Wt 162.6 lb

## 2022-12-10 DIAGNOSIS — G40309 Generalized idiopathic epilepsy and epileptic syndromes, not intractable, without status epilepticus: Secondary | ICD-10-CM | POA: Diagnosis not present

## 2022-12-10 DIAGNOSIS — F819 Developmental disorder of scholastic skills, unspecified: Secondary | ICD-10-CM | POA: Diagnosis not present

## 2022-12-10 MED ORDER — DIVALPROEX SODIUM ER 250 MG PO TB24: 250.0000 mg | ORAL_TABLET | Freq: Every day | ORAL | 3 refills | Status: DC

## 2022-12-10 MED ORDER — DIVALPROEX SODIUM ER 500 MG PO TB24: 500.0000 mg | ORAL_TABLET | Freq: Every day | ORAL | 3 refills | Status: DC

## 2022-12-10 NOTE — Progress Notes (Signed)
PATIENT: [name] DOB: 1970/04/10  REASON FOR VISIT: follow up HISTORY FROM: patient/Caregiver  Primary Neurologist: [place]  ASSESSMENT AND PLAN 52 y.o. year old male  has a past medical history of Anxiety disorder, Autism, BPH (benign prostatic hyperplasia), Dyslipidemia, Psychosis (HCC), and Seizures (HCC). here with:  1.  Seizures 2.  Autism  -Continue Depakote ER 500 +250 mg tablet at bedtime  -I do not see any tremor on exam, will continue to monitor, if side effect from Depakote, will switch to another seizure medication, Lamictal or Zonegran? Sees psychiatry for mood/behaviors on Risperdal  -Previously on Dilantin -Seeing PCP next week, they will check labs -Depakote level was 68 in July 2024 -Call for seizure activity, will follow-up in 6 months or sooner if needed  Meds ordered this encounter  Medications   divalproex (DEPAKOTE ER) 250 MG 24 hr tablet    Sig: Take 1 tablet (250 mg total) by mouth daily.    Dispense:  90 tablet    Refill:  3    To take with an additional Depakote ER 500 for a total of 750 mg nightly   divalproex (DEPAKOTE ER) 500 MG 24 hr tablet    Sig: Take 1 tablet (500 mg total) by mouth at bedtime. To take with an additional 250 for a total of 750 mg nightly    Dispense:  90 tablet    Refill:  3   HISTORY OF PRESENT ILLNESS: Today 12/10/22 At last visit with Dr. Teresa Coombs April 2024 Depakote XR was increased at 750 mg daily due to breakthrough seizure.  Depakote level was 68 in July 2024. Here with Maureen Ralphs, we called nurse at group home, has moved home now. Thinks some of the behaviors on the 750 mg had improved with loud talking. But since at new group home, these behaviors have improved. No seizures since last seen. Has noted some mild tremor to his hands, mostly when anxious, during transition period, I don't see any tremor today on exam.   07/01/21 Dr. Teresa Coombs: [name] presents today for follow-up, he is accompanied by caregiver.  At last visit  in December plan was to switch Dilantin to Depakote.  He was initially put on Depakote 500 mg twice daily but did have side effects.  Depakote was reduced to 500 mg daily and since then he has been doing well except on February 12 when he had a breakthrough seizure described as staring spell and being unresponsive.  Since then he has been doing well on Depakote 500 mg daily.  Denies any side effect.  No other complaints, his latest Depakote level was at 45.  Update 12/19/023 SS:  Here today for follow up, had dilantin level drawn at PCP 02/22/22 34.1, 02/23/22 33.3. these were trough levels reportedly. Takes Dilantin 450 mg daily in the AM. Denies any symptoms, but has been putting his shirt on backwards for a few months, after mentioning symptoms caregiver reports potential shakiness in hands. No falls. Behavior is the same, is loud. Last seizure was several years ago. No new medications or changes to account for the swing.  Update 02/22/21 SS: [name] is here today for follow-up. No recent seizures. Remains on brand name Dilantin, tolerating well. Lives in group home. Reviewed labs from PCP Sept 2022 CMP was normal, TSH was 4.023, CBC okay except mild increased MCV, MCH, HGB was 14.1. No issues today. Here today with caretaker, Maureen Ralphs.   Update 02/03/2020 SS:[name] Naeve is a 52 year old male with history of autism  and seizure disorder.  Seizures remain well controlled, last was in July 2018.  He is on brand-name Dilantin, tolerating well.  He lives in a group home.  In February 2020, his Dilantin level was low at 8.3, dose was increased 450 mg daily from 350 mg daily.  Recent Dilantin level 01/12/20 was 13.5. CBC, CMP reviewed. Doing well, overall, health is stable, no dizziness or falls. Has 4 other housemates, he gets along well.  No seizures.  Presents today for evaluation accompanied by his caregiver, Chip Boer. Sees PCP Dr. Marcello Fennel routinely.   HISTORY 12/23/2018 SS: Mr. Sega is a 52 year old male with  history of autism and seizure disorder.  He was last seen in February 2020 for a dose adjustment of Dilantin, after his Dilantin level was found to be low at 8.3.  Following his office visit, his dose was increased to 450 mg daily, from 350 mg daily.  His last seizure occurred September 14, 2016.  He remains on brand-name Dilantin.  He had a recent Dilantin level checked 12/18/2018 by his primary care doctor that resulted elevated at 21.  This level was not a trough level.  He is here today with his caregiver from the group home, Chip Boer.  He has not had any recent problems.  He has been doing well.  He has not had any falls.  The group home manages and administers his medications.  His primary care doctor is Dr. Marcello Fennel at the [facility].  He presents today for follow-up accompanied by caregiver.   REVIEW OF SYSTEMS: Out of a complete 14 system review of symptoms, the patient complains only of the following symptoms, and all other reviewed systems are negative.  Seizures  ALLERGIES: No Known Allergies  HOME MEDICATIONS: Outpatient Medications Prior to Visit  Medication Sig Dispense Refill   atorvastatin (LIPITOR) 20 MG tablet Take 20 mg by mouth daily.     cetirizine (ZYRTEC) 10 MG tablet Take by mouth.     chlorproMAZINE (THORAZINE) 50 MG tablet Take 50 mg by mouth 3 (three) times daily as needed.     cholecalciferol (VITAMIN D3) 25 MCG (1000 UNIT) tablet Take 1,000 Units by mouth daily.     cloNIDine (CATAPRES - DOSED IN MG/24 HR) 0.2 mg/24hr patch APPLY ONE PATCH ONTO THE SKIN ONCE A WEEK. *REMOVE OLD PATCH* *CHECK BP Q WEEK* (HYPERTENSION)     fluticasone (FLONASE) 50 MCG/ACT nasal spray Place 2 sprays into both nostrils daily.     ketoconazole (NIZORAL) 2 % shampoo Apply 1 Application topically once.     levocetirizine (XYZAL) 5 MG tablet Take 5 mg by mouth every evening.     LORazepam (ATIVAN) 0.5 MG tablet Take 0.5 mg by mouth daily.     LORazepam (ATIVAN) 1 MG tablet Take 0.5 mg by mouth  daily at 2 PM.     Melatonin 5 MG CAPS Take 1 capsule by mouth at bedtime.     Midazolam (NAYZILAM) 5 MG/0.1ML SOLN Give 1 spray (5mg ) into one nostril, one additional spray can be given into the opposite nostril after 10 minutes if needed 3 each 0   mometasone (ELOCON) 0.1 % cream Apply 1 application topically daily.     Omega 3 1000 MG CAPS Take 1 capsule by mouth daily.     ondansetron (ZOFRAN-ODT) 4 MG disintegrating tablet TAKE ONE TABLET BY MOUTH EVERY 8 HOURS AS NEEDED FOR NAUSEA     polyethylene glycol (MIRALAX / GLYCOLAX) packet Mix one packet in 4 ounces of  water on Tuesday and Thursday for constipation.     psyllium (METAMUCIL SMOOTH TEXTURE) 58.6 % powder MIX 1 PACKET AS DIRECTED & TAKE BY MOUTH TWICE DAILY     risperiDONE (RISPERDAL) 0.25 MG tablet      risperiDONE (RISPERDAL) 0.5 MG tablet Take 0.75 mg by mouth 3 (three) times daily. 1.5 tabs po TID     Sodium Fluoride (PREVIDENT 5000 PLUS DT) Place 1 Dose onto teeth as needed.     SUMAtriptan (IMITREX) 25 MG tablet Take by mouth.     triamcinolone lotion (KENALOG) 0.1 % Apply 1 application topically daily.     divalproex (DEPAKOTE ER) 250 MG 24 hr tablet Take 1 tablet (250 mg total) by mouth daily. 90 tablet 3   divalproex (DEPAKOTE ER) 500 MG 24 hr tablet Take 1 tablet (500 mg total) by mouth at bedtime. To take with an additional 250 for a total of 750 mg nightly 90 tablet 3   No facility-administered medications prior to visit.    PAST MEDICAL HISTORY: Past Medical History:  Diagnosis Date   Anxiety disorder    Autism    BPH (benign prostatic hyperplasia)    Dyslipidemia    Psychosis (HCC)    Seizures (HCC)    most recent sz 09/14/16    PAST SURGICAL HISTORY: Past Surgical History:  Procedure Laterality Date   COLONOSCOPY N/A 01/25/2021   Procedure: COLONOSCOPY;  Surgeon: Toledo, Boykin Nearing, MD;  Location: [facility] ENDOSCOPY;  Service: Gastroenterology;  Laterality: N/A;    FAMILY HISTORY: No family history on  file.  SOCIAL HISTORY: Social History   Socioeconomic History   Marital status: Single    Spouse name: Not on file   Number of children: 0   Years of education: Not on file   Highest education level: Not on file  Occupational History   Not on file  Tobacco Use   Smoking status: Never   Smokeless tobacco: Never  Vaping Use   Vaping status: Never Used  Substance and Sexual Activity   Alcohol use: No   Drug use: No   Sexual activity: Not Currently  Other Topics Concern   Not on file  Social History Narrative   Not on file   Social Determinants of Health   Financial Resource Strain: Low Risk  (09/11/2022)   Received from [facility], [facility]   Overall Financial Resource Strain (CARDIA)    Difficulty of Paying Living Expenses: Not hard at all  Food Insecurity: No Food Insecurity (09/11/2022)   Received from [facility], [facility]   Hunger Vital Sign    Worried About Running Out of Food in the Last Year: Never true    Ran Out of Food in the Last Year: Never true  Transportation Needs: No Transportation Needs (09/11/2022)   Received from [facility], [facility]   [facility] - Transportation    In the past 12 months, has lack of transportation kept you from medical appointments or from getting medications?: No    Lack of Transportation (Non-Medical): No  Physical Activity: Not on file  Stress: Not on file  Social Connections: Not on file  Intimate Partner Violence: Not on file   PHYSICAL EXAM  Vitals:   12/10/22 1430  BP: 106/73  Pulse: 85  Weight: 162 lb 9.6 oz (73.8 kg)  Height: 5\' 7"  (1.702 m)   Body mass index is 25.47 kg/m.  Generalized: Well developed,  in no acute distress, well-groomed Neurological examination  Mentation: Alert, history is provided by his caregiver, speech is loud, somewhat abrupt, fast movements, follows most exam commands Cranial  nerve II-XII: Pupils were equal round reactive to light. Extraocular movements were full, visual field were full on confrontational test. Facial sensation and strength were normal. Head turning and shoulder shrug were normal and symmetric. Motor: The motor testing reveals 5 over 5 strength of all 4 extremities. Good symmetric motor tone is noted throughout.  Sensory: Sensory testing is intact to soft touch on all 4 extremities. No evidence of extinction is noted.  Coordination: Cerebellar testing reveals good finger-nose-finger and heel-to-shin bilaterally. No tremor noted.  Gait and station: Gait is normal walks fast   DIAGNOSTIC DATA (LABS, IMAGING, TESTING) - I reviewed patient records, labs, notes, testing and imaging myself where available.  Lab Results  Component Value Date   WBC 6.2 05/20/2022   HGB 13.0 05/20/2022   HCT 38.2 (L) 05/20/2022   MCV 101.6 (H) 05/20/2022   PLT 195 05/20/2022      Component Value Date/Time   NA 141 05/20/2022 1005   NA 137 03/13/2013 2100   K 4.1 05/20/2022 1005   K 3.7 03/13/2013 2100   CL 109 05/20/2022 1005   CL 105 03/13/2013 2100   CO2 28 05/20/2022 1005   CO2 31 03/13/2013 2100   GLUCOSE 65 (L) 05/20/2022 1005   GLUCOSE 105 (H) 03/13/2013 2100   BUN 10 05/20/2022 1005   BUN 14 03/13/2013 2100   CREATININE 0.80 05/20/2022 1005   CREATININE 1.15 03/13/2013 2100   CALCIUM 8.5 (L) 05/20/2022 1005   CALCIUM 9.1 03/13/2013 2100   PROT 6.8 05/20/2022 1005   PROT 7.3 03/13/2013 2100   ALBUMIN 3.9 05/20/2022 1005   ALBUMIN 4.0 03/13/2013 2100   AST 20 05/20/2022 1005   AST 35 03/13/2013 2100   ALT 21 05/20/2022 1005   ALT 27 03/13/2013 2100   ALKPHOS 59 05/20/2022 1005   ALKPHOS 68 03/13/2013 2100   BILITOT 0.7 05/20/2022 1005   BILITOT 0.2 03/13/2013 2100   GFRNONAA >60 05/20/2022 1005   GFRNONAA >60 03/13/2013 2100   GFRAA >60 09/14/2016 1611   GFRAA >60 03/13/2013 2100   No results found for: "CHOL", "HDL", "LDLCALC",  "LDLDIRECT", "TRIG", "CHOLHDL" No results found for: "HGBA1C" No results found for: "VITAMINB12" No results found for: "TSH"  Otila Kluver, DNP  [facility] [address] [place], [place] [address] [phone]

## 2022-12-10 NOTE — Patient Instructions (Signed)
We will continue Depakote.  Please monitor for any side effects (tremor, weight gain).  Please have your primary care doctor check labs including Depakote level next week.  Let me know if [name] has any seizure activity.  I will see him back in 6 months.  Thanks!!

## 2023-02-28 ENCOUNTER — Ambulatory Visit: Payer: Medicare Other | Admitting: Neurology

## 2023-05-28 NOTE — Progress Notes (Unsigned)
 PATIENT: [name] DOB: 07/12/1970  REASON FOR VISIT: follow up HISTORY FROM: patient/Caregiver  Primary Neurologist: [name]  ASSESSMENT AND PLAN 53 y.o. year old male  has a past medical history of Anxiety disorder, Autism, BPH (benign prostatic hyperplasia), Dyslipidemia, Psychosis (HCC), and Seizures (HCC). here with:  1.  Seizures 2.  Autism  -Continue Depakote ER 500 +250 mg tablet at bedtime  -I do not see any tremor on exam, will continue to monitor, if side effect from Depakote, will switch to another seizure medication, Lamictal or Zonegran? Sees psychiatry for mood/behaviors on Risperdal  -Previously on Dilantin -Seeing PCP next week, they will check labs -Depakote level was 68 in July 2024 -Call for seizure activity, will follow-up in 6 months or sooner if needed  No orders of the defined types were placed in this encounter.  HISTORY OF PRESENT ILLNESS: Today 05/28/23   Update 12/10/22 SS: At last visit with [name] April 2024 Depakote XR was increased at 750 mg daily due to breakthrough seizure.  Depakote level was 68 in July 2024. Here with [name], we called nurse at group home, has moved home now. Thinks some of the behaviors on the 750 mg had improved with loud talking. But since at new group home, these behaviors have improved. No seizures since last seen. Has noted some mild tremor to his hands, mostly when anxious, during transition period, I don't see any tremor today on exam.   07/01/21 [name]: [name] presents today for follow-up, he is accompanied by caregiver.  At last visit in December plan was to switch Dilantin to Depakote.  He was initially put on Depakote 500 mg twice daily but did have side effects.  Depakote was reduced to 500 mg daily and since then he has been doing well except on February 12 when he had a breakthrough seizure described as staring spell and being unresponsive.  Since then he has been doing well on Depakote 500 mg daily.  Denies  any side effect.  No other complaints, his latest Depakote level was at 45.  Update 12/19/023 SS:  Here today for follow up, had dilantin level drawn at PCP 02/22/22 34.1, 02/23/22 33.3. these were trough levels reportedly. Takes Dilantin 450 mg daily in the AM. Denies any symptoms, but has been putting his shirt on backwards for a few months, after mentioning symptoms caregiver reports potential shakiness in hands. No falls. Behavior is the same, is loud. Last seizure was several years ago. No new medications or changes to account for the swing.  Update 02/22/21 SS: [name] is here today for follow-up. No recent seizures. Remains on brand name Dilantin, tolerating well. Lives in group home. Reviewed labs from PCP Sept 2022 CMP was normal, TSH was 4.023, CBC okay except mild increased MCV, MCH, HGB was 14.1. No issues today. Here today with caretaker, [name].   Update 02/03/2020 SS:[name] Toppin is a 53 year old male with history of autism and seizure disorder.  Seizures remain well controlled, last was in July 2018.  He is on brand-name Dilantin, tolerating well.  He lives in a group home.  In February 2020, his Dilantin level was low at 8.3, dose was increased 450 mg daily from 350 mg daily.  Recent Dilantin level 01/12/20 was 13.5. CBC, CMP reviewed. Doing well, overall, health is stable, no dizziness or falls. Has 4 other housemates, he gets along well.  No seizures.  Presents today for evaluation accompanied by his caregiver, [name]. Sees PCP Dr. Marcello Cunningham routinely.  HISTORY 12/23/2018 SS: [name] is a 53 year old male with history of autism and seizure disorder.  He was last seen in February 2020 for a dose adjustment of Dilantin, after his Dilantin level was found to be low at 8.3.  Following his office visit, his dose was increased to 450 mg daily, from 350 mg daily.  His last seizure occurred September 14, 2016.  He remains on brand-name Dilantin.  He had a recent Dilantin level checked 12/18/2018 by his  primary care doctor that resulted elevated at 21.  This level was not a trough level.  He is here today with his caregiver from the group home, [name].  He has not had any recent problems.  He has been doing well.  He has not had any falls.  The group home manages and administers his medications.  His primary care doctor is Dr. Marcello Cunningham at the [facility].  He presents today for follow-up accompanied by caregiver.   REVIEW OF SYSTEMS: Out of a complete 14 system review of symptoms, the patient complains only of the following symptoms, and all other reviewed systems are negative.  Seizures  ALLERGIES: No Known Allergies  HOME MEDICATIONS: Outpatient Medications Prior to Visit  Medication Sig Dispense Refill   atorvastatin (LIPITOR) 20 MG tablet Take 20 mg by mouth daily.     cetirizine (ZYRTEC) 10 MG tablet Take by mouth.     chlorproMAZINE (THORAZINE) 50 MG tablet Take 50 mg by mouth 3 (three) times daily as needed.     cholecalciferol (VITAMIN D3) 25 MCG (1000 UNIT) tablet Take 1,000 Units by mouth daily.     cloNIDine (CATAPRES - DOSED IN MG/24 HR) 0.2 mg/24hr patch APPLY ONE PATCH ONTO THE SKIN ONCE A WEEK. *REMOVE OLD PATCH* *CHECK BP Q WEEK* (HYPERTENSION)     divalproex (DEPAKOTE ER) 250 MG 24 hr tablet Take 1 tablet (250 mg total) by mouth daily. 90 tablet 3   divalproex (DEPAKOTE ER) 500 MG 24 hr tablet Take 1 tablet (500 mg total) by mouth at bedtime. To take with an additional 250 for a total of 750 mg nightly 90 tablet 3   fluticasone (FLONASE) 50 MCG/ACT nasal spray Place 2 sprays into both nostrils daily.     ketoconazole (NIZORAL) 2 % shampoo Apply 1 Application topically once.     levocetirizine (XYZAL) 5 MG tablet Take 5 mg by mouth every evening.     LORazepam (ATIVAN) 0.5 MG tablet Take 0.5 mg by mouth daily.     LORazepam (ATIVAN) 1 MG tablet Take 0.5 mg by mouth daily at 2 PM.     Melatonin 5 MG CAPS Take 1 capsule by mouth at bedtime.     Midazolam (NAYZILAM) 5 MG/0.1ML  SOLN Give 1 spray (5mg ) into one nostril, one additional spray can be given into the opposite nostril after 10 minutes if needed 3 each 0   mometasone (ELOCON) 0.1 % cream Apply 1 application topically daily.     Omega 3 1000 MG CAPS Take 1 capsule by mouth daily.     ondansetron (ZOFRAN-ODT) 4 MG disintegrating tablet TAKE ONE TABLET BY MOUTH EVERY 8 HOURS AS NEEDED FOR NAUSEA     polyethylene glycol (MIRALAX / GLYCOLAX) packet Mix one packet in 4 ounces of water on Tuesday and Thursday for constipation.     psyllium (METAMUCIL SMOOTH TEXTURE) 58.6 % powder MIX 1 PACKET AS DIRECTED & TAKE BY MOUTH TWICE DAILY     risperiDONE (RISPERDAL) 0.25 MG tablet  risperiDONE (RISPERDAL) 0.5 MG tablet Take 0.75 mg by mouth 3 (three) times daily. 1.5 tabs po TID     Sodium Fluoride (PREVIDENT 5000 PLUS DT) Place 1 Dose onto teeth as needed.     SUMAtriptan (IMITREX) 25 MG tablet Take by mouth.     triamcinolone lotion (KENALOG) 0.1 % Apply 1 application topically daily.     No facility-administered medications prior to visit.    PAST MEDICAL HISTORY: Past Medical History:  Diagnosis Date   Anxiety disorder    Autism    BPH (benign prostatic hyperplasia)    Dyslipidemia    Psychosis (HCC)    Seizures (HCC)    most recent sz 09/14/16    PAST SURGICAL HISTORY: Past Surgical History:  Procedure Laterality Date   COLONOSCOPY N/A 01/25/2021   Procedure: COLONOSCOPY;  Surgeon: Toledo, Boykin Nearing, MD;  Location: [facility] ENDOSCOPY;  Service: Gastroenterology;  Laterality: N/A;    FAMILY HISTORY: No family history on file.  SOCIAL HISTORY: Social History   Socioeconomic History   Marital status: Single    Spouse name: Not on file   Number of children: 0   Years of education: Not on file   Highest education level: Not on file  Occupational History   Not on file  Tobacco Use   Smoking status: Never   Smokeless tobacco: Never  Vaping Use   Vaping status: Never Used  Substance and Sexual  Activity   Alcohol use: No   Drug use: No   Sexual activity: Not Currently  Other Topics Concern   Not on file  Social History Narrative   Not on file   Social Drivers of Health   Financial Resource Strain: Low Risk  (12/25/2022)   Received from [facility]   Overall Financial Resource Strain (CARDIA)    Difficulty of Paying Living Expenses: Not hard at all  Food Insecurity: No Food Insecurity (12/25/2022)   Received from [facility]   Hunger Vital Sign    Worried About Running Out of Food in the Last Year: Never true    Ran Out of Food in the Last Year: Never true  Transportation Needs: No Transportation Needs (12/25/2022)   Received from [facility] Transportation    In the past 12 months, has lack of transportation kept you from medical appointments or from getting medications?: No    Lack of Transportation (Non-Medical): No  Physical Activity: Not on file  Stress: Not on file  Social Connections: Not on file  Intimate Partner Violence: Not on file   PHYSICAL EXAM  There were no vitals filed for this visit.  There is no height or weight on file to calculate BMI.  Generalized: Well developed, in no acute distress, well-groomed Neurological examination  Mentation: Alert, history is provided by his caregiver, speech is loud, somewhat abrupt, fast movements, follows most exam commands Cranial nerve II-XII: Pupils were equal round reactive to light. Extraocular movements were full, visual field were full on confrontational test. Facial sensation and strength were normal. Head turning and shoulder shrug were normal and symmetric. Motor: The motor testing reveals 5 over 5 strength of all 4 extremities. Good symmetric motor tone is noted throughout.  Sensory: Sensory testing is intact to soft touch on all 4 extremities. No evidence of extinction is noted.  Coordination: Cerebellar testing reveals good  finger-nose-finger and heel-to-shin bilaterally. No tremor noted.  Gait and station: Gait is normal walks fast  DIAGNOSTIC DATA (LABS, IMAGING, TESTING) - I reviewed patient records, labs, notes, testing and imaging myself where available.  Lab Results  Component Value Date   WBC 6.2 05/20/2022   HGB 13.0 05/20/2022   HCT 38.2 (L) 05/20/2022   MCV 101.6 (H) 05/20/2022   PLT 195 05/20/2022      Component Value Date/Time   NA 141 05/20/2022 1005   NA 137 03/13/2013 2100   K 4.1 05/20/2022 1005   K 3.7 03/13/2013 2100   CL 109 05/20/2022 1005   CL 105 03/13/2013 2100   CO2 28 05/20/2022 1005   CO2 31 03/13/2013 2100   GLUCOSE 65 (L) 05/20/2022 1005   GLUCOSE 105 (H) 03/13/2013 2100   BUN 10 05/20/2022 1005   BUN 14 03/13/2013 2100   CREATININE 0.80 05/20/2022 1005   CREATININE 1.15 03/13/2013 2100   CALCIUM 8.5 (L) 05/20/2022 1005   CALCIUM 9.1 03/13/2013 2100   PROT 6.8 05/20/2022 1005   PROT 7.3 03/13/2013 2100   ALBUMIN 3.9 05/20/2022 1005   ALBUMIN 4.0 03/13/2013 2100   AST 20 05/20/2022 1005   AST 35 03/13/2013 2100   ALT 21 05/20/2022 1005   ALT 27 03/13/2013 2100   ALKPHOS 59 05/20/2022 1005   ALKPHOS 68 03/13/2013 2100   BILITOT 0.7 05/20/2022 1005   BILITOT 0.2 03/13/2013 2100   GFRNONAA >60 05/20/2022 1005   GFRNONAA >60 03/13/2013 2100   GFRAA >60 09/14/2016 1611   GFRAA >60 03/13/2013 2100   No results found for: "CHOL", "HDL", "LDLCALC", "LDLDIRECT", "TRIG", "CHOLHDL" No results found for: "HGBA1C" No results found for: "VITAMINB12" No results found for: "TSH"  Otila Kluver, DNP  [facility] [address], [address] [place], [place] [address] [phone]

## 2023-05-29 ENCOUNTER — Ambulatory Visit (INDEPENDENT_AMBULATORY_CARE_PROVIDER_SITE_OTHER): Payer: Medicare Other | Admitting: Neurology

## 2023-05-29 ENCOUNTER — Encounter: Payer: Self-pay | Admitting: Neurology

## 2023-05-29 VITALS — BP 112/79 | HR 84 | Ht 66.0 in | Wt 174.0 lb

## 2023-05-29 DIAGNOSIS — F840 Autistic disorder: Secondary | ICD-10-CM

## 2023-05-29 DIAGNOSIS — G40309 Generalized idiopathic epilepsy and epileptic syndromes, not intractable, without status epilepticus: Secondary | ICD-10-CM

## 2023-05-29 MED ORDER — OXCARBAZEPINE 150 MG PO TABS: ORAL_TABLET | ORAL | 3 refills | Status: DC

## 2023-05-29 NOTE — Patient Instructions (Addendum)
 Week 1-Start Trileptal 150 mg twice a day, reduce Depakote ER 500 mg daily for 1 week   Week 2-Increase Trileptal 300 mg twice a day, reduce Depakote ER 250 mg daily x 1 week   Week 3-Continue Trileptal 300 mg twice a day, stop Depakote  Week 4-come for labs to check Trileptal level, CBC, CMP. Likely plan for higher dosing depending on level (sent myself a reminder to call)  Return in 1 month for labs   Use the [facility] for rescue inhaler for seizures  Call for any seizures

## 2023-05-30 ENCOUNTER — Other Ambulatory Visit: Payer: Self-pay | Admitting: Neurology

## 2023-05-30 NOTE — Telephone Encounter (Signed)
 Last seen yesterday, next appt scheduled 01/16/24  Dispenses  No dispenses within 180 days of the adherence period (since 06/03/2022)   How do dispenses affect the score?    Outpatient Orders   Start Date End Date Dispense Refills Pharmacy   Midazolam (NAYZILAM) 5 MG/0.1ML SOLN 03/01/2022 -- 3 each 0 [facility].Marland KitchenMarland Kitchen

## 2023-05-31 ENCOUNTER — Telehealth: Payer: Self-pay | Admitting: Diagnostic Neuroimaging

## 2023-05-31 MED ORDER — NAYZILAM 5 MG/0.1ML NA SOLN: NASAL | 3 refills | Status: AC

## 2023-05-31 NOTE — Telephone Encounter (Signed)
 Meds ordered this encounter  Medications   Midazolam (NAYZILAM) 5 MG/0.1ML SOLN    Sig: Give 1 spray (5mg ) into one nostril, one additional spray can be given into the opposite nostril after 10 minutes if needed    Dispense:  3 each    Refill:  3   Requesting nayzilam refill.   Suanne Marker, MD 05/31/2023, 3:00 PM Certified in Neurology, Neurophysiology and Neuroimaging  [facility] [address] [place], [place] [address] [phone]

## 2023-06-18 ENCOUNTER — Telehealth: Payer: Self-pay | Admitting: Neurology

## 2023-06-18 DIAGNOSIS — G40309 Generalized idiopathic epilepsy and epileptic syndromes, not intractable, without status epilepticus: Secondary | ICD-10-CM

## 2023-06-18 NOTE — Telephone Encounter (Signed)
 Call to patient, no answer. Left message to return call

## 2023-06-18 NOTE — Telephone Encounter (Signed)
 Can you check on patient with scheduled up titration of Trileptal and weaning Depakote? Schedule is below. Next week should be on Week 4. Come for labs next week.   Week 1-Start Trileptal 150 mg twice a day, reduce Depakote ER 500 mg daily for 1 week              Week 2-Increase Trileptal 300 mg twice a day, reduce Depakote ER 250 mg daily x 1 week              Week 3-Continue Trileptal 300 mg twice a day, stop Depakote             Week 4-come for labs to check Trileptal level, CBC, CMP. Likely plan for higher dosing depending on level  Would recommend coming next week or the week after for labs (April 14 or 21). I will place the orders.

## 2023-06-19 NOTE — Telephone Encounter (Signed)
 Call to [facility], reviewed medication taper/titrations and they are on schedule. She reports no seizure but still has some tremors at times. She is in agreement to take for lab work in the next 2 weeks. (trough level). She was appreciative of call

## 2023-07-05 ENCOUNTER — Other Ambulatory Visit
Admission: RE | Admit: 2023-07-05 | Discharge: 2023-07-05 | Disposition: A | Discharge status: Home / Self Care | Attending: Neurology | Admitting: Neurology

## 2023-07-05 DIAGNOSIS — G40309 Generalized idiopathic epilepsy and epileptic syndromes, not intractable, without status epilepticus: Secondary | ICD-10-CM | POA: Insufficient documentation

## 2023-07-05 LAB — COMPREHENSIVE METABOLIC PANEL WITH GFR
ALT: 23 U/L (ref 0–44)
AST: 20 U/L (ref 15–41)
Albumin: 4.6 g/dL (ref 3.5–5.0)
Alkaline Phosphatase: 73 U/L (ref 38–126)
Anion gap: 7 (ref 5–15)
BUN: 9 mg/dL (ref 6–20)
CO2: 24 mmol/L (ref 22–32)
Calcium: 9.3 mg/dL (ref 8.9–10.3)
Chloride: 103 mmol/L (ref 98–111)
Creatinine, Ser: 0.91 mg/dL (ref 0.61–1.24)
GFR, Estimated: >60 mL/min (ref >60)
Glucose, Bld: 92 mg/dL (ref 70–99)
Potassium: 4.4 mmol/L (ref 3.5–5.1)
Sodium: 134 mmol/L — ABNORMAL LOW (ref 135–145)
Total Bilirubin: 1 mg/dL (ref 0.0–1.2)
Total Protein: 8.1 g/dL (ref 6.5–8.1)

## 2023-07-05 LAB — CBC WITH DIFFERENTIAL/PLATELET
Abs Immature Granulocytes: 0.02 10*3/uL (ref 0.00–0.07)
Basophils Absolute: 0 10*3/uL (ref 0.0–0.1)
Basophils Relative: 0 %
Eosinophils Absolute: 0 10*3/uL (ref 0.0–0.5)
Eosinophils Relative: 0 %
HCT: 39.9 % (ref 39.0–52.0)
Hemoglobin: 14.1 g/dL (ref 13.0–17.0)
Immature Granulocytes: 0 %
Lymphocytes Relative: 18 %
Lymphs Abs: 1.4 10*3/uL (ref 0.7–4.0)
MCH: 34.6 pg — ABNORMAL HIGH (ref 26.0–34.0)
MCHC: 35.3 g/dL (ref 30.0–36.0)
MCV: 97.8 fL (ref 80.0–100.0)
Monocytes Absolute: 0.7 10*3/uL (ref 0.1–1.0)
Monocytes Relative: 9 %
Neutro Abs: 5.6 10*3/uL (ref 1.7–7.7)
Neutrophils Relative %: 73 %
Platelets: 271 10*3/uL (ref 150–400)
RBC: 4.08 MIL/uL — ABNORMAL LOW (ref 4.22–5.81)
RDW: 11.9 % (ref 11.5–15.5)
WBC: 7.7 10*3/uL (ref 4.0–10.5)
nRBC: 0 % (ref 0.0–0.2)

## 2023-07-05 NOTE — Addendum Note (Signed)
 Addended by: Letty Salvi C on: 07/05/2023 08:14 AM   Modules accepted: Orders

## 2023-07-08 LAB — 10-HYDROXYCARBAZEPINE: Triliptal/MTB(Oxcarbazepin): 8 ug/mL — ABNORMAL LOW (ref 10–35)

## 2023-07-09 ENCOUNTER — Telehealth: Payer: Self-pay | Admitting: Neurology

## 2023-07-09 NOTE — Telephone Encounter (Signed)
 Called the number listed on the main screen:  Spoke with [name]) not on dpr. She then named off several people that are also not listed on dpr. Thenumber on dpr is the main office number. Clarence stated that I need to talk to [name] (guardian) her # [phone] she is also not on dpr. I do not feel comfortable calling anyone that isn't listed on dpr so I will route back to provider

## 2023-07-09 NOTE — Telephone Encounter (Signed)
 Please call patient to see how doing on Trileptal .  Recent labs show mildly low sodium level at 134, Trileptal  level little low at 8.  Will likely recheck sodium level to see if baseline.  Hyponatremia can be side effect of Trileptal . May be best to come in and schedule office visit to discuss medications.

## 2023-07-09 NOTE — Telephone Encounter (Signed)
 Lvm 1st attempt to vivian as instructed by provider

## 2023-07-10 NOTE — Telephone Encounter (Signed)
 Called and spoke to vivian who stated that pt tremors have worsened and constant, worse at times cince being being on trileptal . Due to being fasting he didn't take the am dose that day of labwork. Offered appt but she stated that she would call back when she has her appt book near but that she does infact want appt prior to repeating sodium level to discuss med change

## 2023-07-10 NOTE — Telephone Encounter (Signed)
 Waiting on callback from vivian when she has appt book ready but will make next appt w/Dr. Samara Crest as asked by Alphonza Ashing NP

## 2023-07-11 ENCOUNTER — Ambulatory Visit: Payer: Medicare Other | Admitting: Neurology

## 2023-07-11 NOTE — Telephone Encounter (Signed)
 Called and left message for vivian 1st attempt by hf

## 2023-07-15 NOTE — Telephone Encounter (Signed)
 Vivian called and I was able to schedule the pt

## 2023-07-15 NOTE — Telephone Encounter (Signed)
 Called and lvm 2nd attempt by hf

## 2023-08-20 ENCOUNTER — Other Ambulatory Visit: Payer: Self-pay | Admitting: Neurology

## 2023-09-10 ENCOUNTER — Encounter: Payer: Self-pay | Admitting: Neurology

## 2023-09-10 ENCOUNTER — Ambulatory Visit: Admitting: Neurology

## 2023-11-20 ENCOUNTER — Encounter: Payer: Self-pay | Admitting: Neurology

## 2023-11-20 ENCOUNTER — Ambulatory Visit (INDEPENDENT_AMBULATORY_CARE_PROVIDER_SITE_OTHER): Admitting: Neurology

## 2023-11-20 VITALS — BP 122/70 | HR 97 | Ht 67.0 in | Wt 177.0 lb

## 2023-11-20 DIAGNOSIS — F840 Autistic disorder: Secondary | ICD-10-CM | POA: Diagnosis not present

## 2023-11-20 DIAGNOSIS — F819 Developmental disorder of scholastic skills, unspecified: Secondary | ICD-10-CM

## 2023-11-20 DIAGNOSIS — G40309 Generalized idiopathic epilepsy and epileptic syndromes, not intractable, without status epilepticus: Secondary | ICD-10-CM

## 2023-11-20 MED ORDER — DIVALPROEX SODIUM ER 500 MG PO TB24: 500.0000 mg | ORAL_TABLET | Freq: Every day | ORAL | 3 refills | Status: DC

## 2023-11-20 NOTE — Progress Notes (Addendum)
 PATIENT: [name] DOB: 17-Nov-1970  REASON FOR VISIT: follow up HISTORY FROM: patient/Caregiver  Primary Neurologist: [name]  ASSESSMENT AND PLAN 53 y.o. year old male  has a past medical history of Anxiety disorder, Autism, BPH (benign prostatic hyperplasia), Dyslipidemia, Psychosis (HCC), and Seizures (HCC). here with:  1.  Seizures 2.  Autism  -Continue to have breakthrough seizure, 3 seizures since last visit -Continue Trileptal  300 mg twice daily -Start Depakote  extended release 500 mg nightly -Use Nayzilam  as needed for breakthrough seizure -Follows with psychiatry for mood/behaviors on Risperdal , lorazepam -Previously on Dilantin  (toxicity) -Call for seizure activity, will follow-up in 3 months or sooner if needed  Meds ordered this encounter  Medications   divalproex  (DEPAKOTE  ER) 500 MG 24 hr tablet    Sig: Take 1 tablet (500 mg total) by mouth at bedtime. To take with an additional 250 for a total of 750 mg nightly    Dispense:  90 tablet    Refill:  3   HISTORY OF PRESENT ILLNESS: Today 11/20/23 Patient presents today for follow-up, she is accompanied by her caregiver.  Last visit was in March at that time Depakote  was discontinued due to reported tremors and Trileptal  was started.  Currently patient is on Trileptal  300 mg twice daily but does have breakthrough seizures, a total of 3, last 1 being on July 27.  With the tremors, caregiver tells me that tremor are still present but they are not debilitating.  Patient has tremor which occur when he is excited; the tremor does not cause him to spill his drink or drop his food and he is able to write his name.  He is also taking other medication as has tremor side effect including Risperdal  and Thorazine.  Update 05/29/23 SS: Here with caregiver, [name]. Last seizure was Dec 20, was long day, shopping, 1st looked disoriented, acting confused, tremulous, fell to the ground, unconscious, generalized shaking, lasted no  more than a minute, couldn't get him up for 15 minutes. No oral injury or incontinence. Remains on Depakote  ER 750 mg daily. Caregiver notes tremor at times as side effect.  Update 12/10/22 SS: At last visit with [name] April 2024 Depakote  XR was increased at 750 mg daily due to breakthrough seizure.  Depakote  level was 68 in July 2024. Here with [name], we called nurse at group home, has moved home now. Thinks some of the behaviors on the 750 mg had improved with loud talking. But since at new group home, these behaviors have improved. No seizures since last seen. Has noted some mild tremor to his hands, mostly when anxious, during transition period, I don't see any tremor today on exam.   07/01/21 [name]: Clarence presents today for follow-up, he is accompanied by caregiver.  At last visit in December plan was to switch Dilantin  to Depakote .  He was initially put on Depakote  500 mg twice daily but did have side effects.  Depakote  was reduced to 500 mg daily and since then he has been doing well except on February 12 when he had a breakthrough seizure described as staring spell and being unresponsive.  Since then he has been doing well on Depakote  500 mg daily.  Denies any side effect.  No other complaints, his latest Depakote  level was at 45.  Update 12/19/023 SS:  Here today for follow up, had dilantin  level drawn at PCP 02/22/22 34.1, 02/23/22 33.3. these were trough levels reportedly. Takes Dilantin  450 mg daily in the AM. Denies  any symptoms, but has been putting his shirt on backwards for a few months, after mentioning symptoms caregiver reports potential shakiness in hands. No falls. Behavior is the same, is loud. Last seizure was several years ago. No new medications or changes to account for the swing.  Update 02/22/21 SS: Clarence is here today for follow-up. No recent seizures. Remains on brand name Dilantin , tolerating well. Lives in group home. Reviewed labs from PCP Sept 2022 CMP was normal, TSH  was 4.023, CBC okay except mild increased MCV, MCH, HGB was 14.1. No issues today. Here today with caretaker, [name].   Update 02/03/2020 SS:[name] is a 53 year old male with history of autism and seizure disorder.  Seizures remain well controlled, last was in July 2018.  He is on brand-name Dilantin , tolerating well.  He lives in a group home.  In February 2020, his Dilantin  level was low at 8.3, dose was increased 450 mg daily from 350 mg daily.  Recent Dilantin  level 01/12/20 was 13.5. CBC, CMP reviewed. Doing well, overall, health is stable, no dizziness or falls. Has 4 other housemates, he gets along well.  No seizures.  Presents today for evaluation accompanied by his caregiver, [name]. Sees PCP [name] routinely.   HISTORY 12/23/2018 SS: [name] is a 53 year old male with history of autism and seizure disorder.  He was last seen in February 2020 for a dose adjustment of Dilantin , after his Dilantin  level was found to be low at 8.3.  Following his office visit, his dose was increased to 450 mg daily, from 350 mg daily.  His last seizure occurred September 14, 2016.  He remains on brand-name Dilantin .  He had a recent Dilantin  level checked 12/18/2018 by his primary care doctor that resulted elevated at 21.  This level was not a trough level.  He is here today with his caregiver from the group home, [name].  He has not had any recent problems.  He has been doing well.  He has not had any falls.  The group home manages and administers his medications.  His primary care doctor is [name] at the [facility].  He presents today for follow-up accompanied by caregiver.   REVIEW OF SYSTEMS: Out of a complete 14 system review of symptoms, the patient complains only of the following symptoms, and all other reviewed systems are negative.  Seizures  ALLERGIES: No Known Allergies  HOME MEDICATIONS: Outpatient Medications Prior to Visit  Medication Sig Dispense Refill   atorvastatin (LIPITOR) 20 MG  tablet Take 20 mg by mouth daily.     cetirizine (ZYRTEC) 10 MG tablet Take by mouth.     chlorproMAZINE (THORAZINE) 50 MG tablet Take 50 mg by mouth 3 (three) times daily as needed.     cholecalciferol (VITAMIN D3) 25 MCG (1000 UNIT) tablet Take 1,000 Units by mouth daily.     cloNIDine (CATAPRES - DOSED IN MG/24 HR) 0.2 mg/24hr patch APPLY ONE PATCH ONTO THE SKIN ONCE A WEEK. *REMOVE OLD PATCH* *CHECK BP Q WEEK* (HYPERTENSION)     fluticasone (FLONASE) 50 MCG/ACT nasal spray Place 2 sprays into both nostrils daily.     ketoconazole (NIZORAL) 2 % shampoo Apply 1 Application topically once.     levocetirizine (XYZAL) 5 MG tablet Take 5 mg by mouth every evening.     LORazepam (ATIVAN) 0.5 MG tablet Take 0.5 mg by mouth daily.     LORazepam (ATIVAN) 1 MG tablet Take 0.5 mg by mouth daily at 2 PM.  Melatonin 5 MG CAPS Take 1 capsule by mouth at bedtime.     Midazolam  (NAYZILAM ) 5 MG/0.1ML SOLN Give 1 spray (5mg ) into one nostril, one additional spray can be given into the opposite nostril after 10 minutes if needed 3 each 3   mometasone (ELOCON) 0.1 % cream Apply 1 application topically daily.     Omega 3 1000 MG CAPS Take 1 capsule by mouth daily.     ondansetron (ZOFRAN-ODT) 4 MG disintegrating tablet TAKE ONE TABLET BY MOUTH EVERY 8 HOURS AS NEEDED FOR NAUSEA     OXcarbazepine  (TRILEPTAL ) 150 MG tablet Start 1 tablet twice daily for 1 week, then take 2 tablets twice daily 120 tablet 3   Oxcarbazepine  (TRILEPTAL ) 300 MG tablet TAKE 1 TABLET BY MOUTH TWICE DAILY *HAZARDOUS DRUG: WEAR GLOVES* *DO NOT CRUSH* *NOTE DOSE* 180 tablet 1   polyethylene glycol (MIRALAX / GLYCOLAX) packet Mix one packet in 4 ounces of water on Tuesday and Thursday for constipation.     psyllium (METAMUCIL SMOOTH TEXTURE) 58.6 % powder MIX 1 PACKET AS DIRECTED & TAKE BY MOUTH TWICE DAILY     risperiDONE  (RISPERDAL ) 0.25 MG tablet      risperiDONE  (RISPERDAL ) 0.5 MG tablet Take 0.75 mg by mouth 3 (three) times daily. 1.5  tabs po TID     Sodium Fluoride (PREVIDENT 5000 PLUS DT) Place 1 Dose onto teeth as needed.     SUMAtriptan (IMITREX) 25 MG tablet Take by mouth.     triamcinolone lotion (KENALOG) 0.1 % Apply 1 application topically daily.     divalproex  (DEPAKOTE  ER) 250 MG 24 hr tablet Take 1 tablet (250 mg total) by mouth daily. 90 tablet 3   divalproex  (DEPAKOTE  ER) 500 MG 24 hr tablet Take 1 tablet (500 mg total) by mouth at bedtime. To take with an additional 250 for a total of 750 mg nightly 90 tablet 3   No facility-administered medications prior to visit.    PAST MEDICAL HISTORY: Past Medical History:  Diagnosis Date   Anxiety disorder    Autism    BPH (benign prostatic hyperplasia)    Dyslipidemia    Psychosis (HCC)    Seizures (HCC)    most recent sz 09/14/16    PAST SURGICAL HISTORY: Past Surgical History:  Procedure Laterality Date   COLONOSCOPY N/A 01/25/2021   Procedure: COLONOSCOPY;  Surgeon: Toledo, Ladell POUR, MD;  Location: [facility] ENDOSCOPY;  Service: Gastroenterology;  Laterality: N/A;    FAMILY HISTORY: History reviewed. No pertinent family history.  SOCIAL HISTORY: Social History   Socioeconomic History   Marital status: Single    Spouse name: Not on file   Number of children: 0   Years of education: Not on file   Highest education level: Not on file  Occupational History   Not on file  Tobacco Use   Smoking status: Never   Smokeless tobacco: Never  Vaping Use   Vaping status: Never Used  Substance and Sexual Activity   Alcohol use: No   Drug use: No   Sexual activity: Not Currently  Other Topics Concern   Not on file  Social History Narrative   Right handed   No caffeine   Lives at group house, [facility] [place] [id number]   Social Drivers of Health   Financial Resource Strain: Low Risk  (12/25/2022)   Received from [facility]   Overall Financial Resource Strain (CARDIA)    Difficulty of Paying Living Expenses: Not hard  at all  Food Insecurity: No Food Insecurity (12/25/2022)   Received from [facility]   Hunger Vital Sign    Within the past 12 months, you worried that your food would run out before you got the money to buy more.: Never true    Within the past 12 months, the food you bought just didn't last and you didn't have money to get more.: Never true  Transportation Needs: No Transportation Needs (12/25/2022)   Received from [facility] - Transportation    In the past 12 months, has lack of transportation kept you from medical appointments or from getting medications?: No    Lack of Transportation (Non-Medical): No  Physical Activity: Not on file  Stress: Not on file  Social Connections: Not on file  Intimate Partner Violence: Not on file   PHYSICAL EXAM  Vitals:   11/20/23 1336  BP: 122/70  Pulse: 97  SpO2: 98%  Weight: 177 lb (80.3 kg)  Height: 5' 7 (1.702 m)    Body mass index is 27.72 kg/m.  Generalized: Well developed, in no acute distress, well-groomed Neurological examination  Mentation: Alert, history is provided by his caregiver, speech is loud, somewhat abrupt, fast movements, follows most exam commands Cranial nerve II-XII: Pupils were equal round reactive to light. Extraocular movements were full, visual field were full on confrontational test. Facial sensation and strength were normal. Head turning and shoulder shrug were normal and symmetric. Motor: The motor testing reveals 5 over 5 strength of all 4 extremities. Good symmetric motor tone is noted throughout.  Sensory: Sensory testing is intact to soft touch on all 4 extremities. No evidence of extinction is noted.  Coordination: Cerebellar testing reveals good finger-nose-finger and heel-to-shin bilaterally. No tremor noted.  Gait and station: Gait is normal walks fast   DIAGNOSTIC DATA (LABS, IMAGING, TESTING) - I reviewed patient records, labs, notes, testing and imaging  myself where available.  Lab Results  Component Value Date   WBC 7.7 07/05/2023   HGB 14.1 07/05/2023   HCT 39.9 07/05/2023   MCV 97.8 07/05/2023   PLT 271 07/05/2023      Component Value Date/Time   NA 134 (L) 07/05/2023 0819   NA 137 03/13/2013 2100   K 4.4 07/05/2023 0819   K 3.7 03/13/2013 2100   CL 103 07/05/2023 0819   CL 105 03/13/2013 2100   CO2 24 07/05/2023 0819   CO2 31 03/13/2013 2100   GLUCOSE 92 07/05/2023 0819   GLUCOSE 105 (H) 03/13/2013 2100   BUN 9 07/05/2023 0819   BUN 14 03/13/2013 2100   CREATININE 0.91 07/05/2023 0819   CREATININE 1.15 03/13/2013 2100   CALCIUM 9.3 07/05/2023 0819   CALCIUM 9.1 03/13/2013 2100   PROT 8.1 07/05/2023 0819   PROT 7.3 03/13/2013 2100   ALBUMIN 4.6 07/05/2023 0819   ALBUMIN 4.0 03/13/2013 2100   AST 20 07/05/2023 0819   AST 35 03/13/2013 2100   ALT 23 07/05/2023 0819   ALT 27 03/13/2013 2100   ALKPHOS 73 07/05/2023 0819   ALKPHOS 68 03/13/2013 2100   BILITOT 1.0 07/05/2023 0819   BILITOT 0.2 03/13/2013 2100   GFRNONAA >60 07/05/2023 0819   GFRNONAA >60 03/13/2013 2100   GFRAA >60 09/14/2016 1611   GFRAA >60 03/13/2013 2100   No results found for: CHOL, HDL, LDLCALC, LDLDIRECT, TRIG, CHOLHDL No results found for: YHAJ8R No results found for: VITAMINB12 No results found for: TSH  I have  spent a total of 30 minutes dedicated to this patient today, preparing to see patient, performing a medically appropriate examination and evaluation, ordering tests and/or medications and procedures, and counseling and educating the patient/family/caregiver; independently interpreting result and communicating results to the family/patient/caregiver; and documenting clinical information in the electronic medical record.   Pastor Falling, MD  [facility] [address] [place], [place] [address] [phone]

## 2024-01-16 ENCOUNTER — Encounter: Payer: Self-pay | Admitting: Neurology

## 2024-01-16 ENCOUNTER — Ambulatory Visit (INDEPENDENT_AMBULATORY_CARE_PROVIDER_SITE_OTHER): Admitting: Neurology

## 2024-01-16 VITALS — BP 119/81 | HR 77 | Wt 185.0 lb

## 2024-01-16 DIAGNOSIS — F819 Developmental disorder of scholastic skills, unspecified: Secondary | ICD-10-CM | POA: Diagnosis not present

## 2024-01-16 DIAGNOSIS — G40309 Generalized idiopathic epilepsy and epileptic syndromes, not intractable, without status epilepticus: Secondary | ICD-10-CM

## 2024-01-16 MED ORDER — DIVALPROEX SODIUM ER 500 MG PO TB24: 500.0000 mg | ORAL_TABLET | Freq: Every day | ORAL | 3 refills | Status: AC

## 2024-01-16 MED ORDER — OXCARBAZEPINE 300 MG PO TABS: 300.0000 mg | ORAL_TABLET | Freq: Two times a day (BID) | ORAL | 3 refills | Status: AC

## 2024-01-16 MED ORDER — DIVALPROEX SODIUM ER 250 MG PO TB24
250.0000 mg | ORAL_TABLET | Freq: Every day | ORAL | 3 refills | Status: AC
Start: 2024-01-16 — End: ?

## 2024-01-16 NOTE — Progress Notes (Signed)
 PATIENT: [name] DOB: 1970-04-10  REASON FOR VISIT: follow up HISTORY FROM: patient/Caregiver  Primary Neurologist: Camara  ASSESSMENT AND PLAN 53 y.o. year old male  has a past medical history of Anxiety disorder, Autism, BPH (benign prostatic hyperplasia), Dyslipidemia, Psychosis (HCC), and Seizures (HCC). here with:  1.  Seizures 2.  Autism  - Doing well without seizures on Trileptal  300 mg twice daily, Depakote  ER 750 mg daily (plan for him to go back on just 500 mg, but went back to 750 mg).  Doing well on current dosing without tremor or recurrent seizure.  Will continue current dosing. - Check Depakote , Trileptal  level - Use Nayzilam  as needed for breakthrough seizure - Follows with psychiatry for mood/behaviors on Risperdal , lorazepam - Previously on Dilantin  (toxicity) - Call for seizure activity, will follow-up in 1 year   Meds ordered this encounter  Medications   divalproex  (DEPAKOTE  ER) 500 MG 24 hr tablet    Sig: Take 1 tablet (500 mg total) by mouth at bedtime. To take with an additional 250 for a total of 750 mg nightly    Dispense:  90 tablet    Refill:  3   divalproex  (DEPAKOTE  ER) 250 MG 24 hr tablet    Sig: Take 1 tablet (250 mg total) by mouth at bedtime. With the Depakote  ER 500 mg    Dispense:  90 tablet    Refill:  3   Oxcarbazepine  (TRILEPTAL ) 300 MG tablet    Sig: Take 1 tablet (300 mg total) by mouth 2 (two) times daily.    Dispense:  180 tablet    Refill:  3   HISTORY OF PRESENT ILLNESS: Today 01/16/24 01/16/24 SS: Last visit added Depakote  ER 500 mg nightly, continue Trileptal  300 mg BID.  Looks like patient taking Depakote  ER 750 mg + Trileptal  300 mg BID.  No seizures.  Denies tremor.  Doing well at group home, no issues. Labs 12/12/23 CMP normal, CBC unremarkable. I called Vivian.   11/20/23 Dr. Gregg: Patient presents today for follow-up, she is accompanied by her caregiver.  Last visit was in March at that time Depakote  was  discontinued due to reported tremors and Trileptal  was started.  Currently patient is on Trileptal  300 mg twice daily but does have breakthrough seizures, a total of 3, last 1 being on July 27.  With the tremors, caregiver tells me that tremor are still present but they are not debilitating.  Patient has tremor which occur when he is excited; the tremor does not cause him to spill his drink or drop his food and he is able to write his name.  He is also taking other medication as has tremor side effect including Risperdal  and Thorazine.  Update 05/29/23 SS: Here with caregiver, Vivian. Last seizure was Dec 20, was long day, shopping, 1st looked disoriented, acting confused, tremulous, fell to the ground, unconscious, generalized shaking, lasted no more than a minute, couldn't get him up for 15 minutes. No oral injury or incontinence. Remains on Depakote  ER 750 mg daily. Caregiver notes tremor at times as side effect.  Update 12/10/22 SS: At last visit with Dr. Camara April 2024 Depakote  XR was increased at 750 mg daily due to breakthrough seizure.  Depakote  level was 68 in July 2024. Here with Vivian, we called nurse at group home, has moved home now. Thinks some of the behaviors on the 750 mg had improved with loud talking. But since at new group home, these behaviors have improved.  No seizures since last seen. Has noted some mild tremor to his hands, mostly when anxious, during transition period, I don't see any tremor today on exam.   07/01/21 Dr. Gregg: [name] presents today for follow-up, he is accompanied by caregiver.  At last visit in December plan was to switch Dilantin  to Depakote .  He was initially put on Depakote  500 mg twice daily but did have side effects.  Depakote  was reduced to 500 mg daily and since then he has been doing well except on February 12 when he had a breakthrough seizure described as staring spell and being unresponsive.  Since then he has been doing well on Depakote  500 mg daily.   Denies any side effect.  No other complaints, his latest Depakote  level was at 45.  Update 12/19/023 SS:  Here today for follow up, had dilantin  level drawn at PCP 02/22/22 34.1, 02/23/22 33.3. these were trough levels reportedly. Takes Dilantin  450 mg daily in the AM. Denies any symptoms, but has been putting his shirt on backwards for a few months, after mentioning symptoms caregiver reports potential shakiness in hands. No falls. Behavior is the same, is loud. Last seizure was several years ago. No new medications or changes to account for the swing.  Update 02/22/21 SS: [name] is here today for follow-up. No recent seizures. Remains on brand name Dilantin , tolerating well. Lives in group home. Reviewed labs from PCP Sept 2022 CMP was normal, TSH was 4.023, CBC okay except mild increased MCV, MCH, HGB was 14.1. No issues today. Here today with caretaker, Vivian.   Update 02/03/2020 SS:[name] is a 53 year old male with history of autism and seizure disorder.  Seizures remain well controlled, last was in July 2018.  He is on brand-name Dilantin , tolerating well.  He lives in a group home.  In February 2020, his Dilantin  level was low at 8.3, dose was increased 450 mg daily from 350 mg daily.  Recent Dilantin  level 01/12/20 was 13.5. CBC, CMP reviewed. Doing well, overall, health is stable, no dizziness or falls. Has 4 other housemates, he gets along well.  No seizures.  Presents today for evaluation accompanied by his caregiver, Orie. Sees PCP Dr. Sadie routinely.   HISTORY 12/23/2018 SS: [name] is a 53 year old male with history of autism and seizure disorder.  He was last seen in February 2020 for a dose adjustment of Dilantin , after his Dilantin  level was found to be low at 8.3.  Following his office visit, his dose was increased to 450 mg daily, from 350 mg daily.  His last seizure occurred September 14, 2016.  He remains on brand-name Dilantin .  He had a recent Dilantin  level checked 12/18/2018 by  his primary care doctor that resulted elevated at 21.  This level was not a trough level.  He is here today with his caregiver from the group home, Orie.  He has not had any recent problems.  He has been doing well.  He has not had any falls.  The group home manages and administers his medications.  His primary care doctor is Dr. Sadie at the [facility]  He presents today for follow-up accompanied by caregiver.   REVIEW OF SYSTEMS: Out of a complete 14 system review of symptoms, the patient complains only of the following symptoms, and all other reviewed systems are negative.  Seizures  ALLERGIES: No Known Allergies  HOME MEDICATIONS: Outpatient Medications Prior to Visit  Medication Sig Dispense Refill   atorvastatin (LIPITOR) 20 MG tablet  Take 20 mg by mouth daily.     cetirizine (ZYRTEC) 10 MG tablet Take by mouth.     chlorproMAZINE (THORAZINE) 50 MG tablet Take 50 mg by mouth 3 (three) times daily as needed.     cholecalciferol (VITAMIN D3) 25 MCG (1000 UNIT) tablet Take 1,000 Units by mouth daily.     cloNIDine (CATAPRES - DOSED IN MG/24 HR) 0.2 mg/24hr patch APPLY ONE PATCH ONTO THE SKIN ONCE A WEEK. *REMOVE OLD PATCH* *CHECK BP Q WEEK* (HYPERTENSION)     divalproex  (DEPAKOTE  ER) 500 MG 24 hr tablet Take 1 tablet (500 mg total) by mouth at bedtime. To take with an additional 250 for a total of 750 mg nightly 90 tablet 3   fluticasone (FLONASE) 50 MCG/ACT nasal spray Place 2 sprays into both nostrils daily.     ketoconazole (NIZORAL) 2 % shampoo Apply 1 Application topically once.     levocetirizine (XYZAL) 5 MG tablet Take 5 mg by mouth every evening.     LORazepam (ATIVAN) 0.5 MG tablet Take 0.5 mg by mouth daily.     LORazepam (ATIVAN) 1 MG tablet Take 0.5 mg by mouth daily at 2 PM.     Melatonin 5 MG CAPS Take 1 capsule by mouth at bedtime.     Midazolam  (NAYZILAM ) 5 MG/0.1ML SOLN Give 1 spray (5mg ) into one nostril, one additional spray can be given into the opposite nostril  after 10 minutes if needed 3 each 3   mometasone (ELOCON) 0.1 % cream Apply 1 application topically daily.     Omega 3 1000 MG CAPS Take 1 capsule by mouth daily.     ondansetron (ZOFRAN-ODT) 4 MG disintegrating tablet TAKE ONE TABLET BY MOUTH EVERY 8 HOURS AS NEEDED FOR NAUSEA     OXcarbazepine  (TRILEPTAL ) 150 MG tablet Start 1 tablet twice daily for 1 week, then take 2 tablets twice daily 120 tablet 3   Oxcarbazepine  (TRILEPTAL ) 300 MG tablet TAKE 1 TABLET BY MOUTH TWICE DAILY *HAZARDOUS DRUG: WEAR GLOVES* *DO NOT CRUSH* *NOTE DOSE* 180 tablet 1   polyethylene glycol (MIRALAX / GLYCOLAX) packet Mix one packet in 4 ounces of water on Tuesday and Thursday for constipation.     psyllium (METAMUCIL SMOOTH TEXTURE) 58.6 % powder MIX 1 PACKET AS DIRECTED & TAKE BY MOUTH TWICE DAILY     risperiDONE  (RISPERDAL ) 0.25 MG tablet      risperiDONE  (RISPERDAL ) 0.5 MG tablet Take 0.75 mg by mouth 3 (three) times daily. 1.5 tabs po TID     Sodium Fluoride (PREVIDENT 5000 PLUS DT) Place 1 Dose onto teeth as needed.     SUMAtriptan (IMITREX) 25 MG tablet Take by mouth.     triamcinolone lotion (KENALOG) 0.1 % Apply 1 application topically daily.     No facility-administered medications prior to visit.    PAST MEDICAL HISTORY: Past Medical History:  Diagnosis Date   Anxiety disorder    Autism    BPH (benign prostatic hyperplasia)    Dyslipidemia    Psychosis (HCC)    Seizures (HCC)    most recent sz 09/14/16    PAST SURGICAL HISTORY: Past Surgical History:  Procedure Laterality Date   COLONOSCOPY N/A 01/25/2021   Procedure: COLONOSCOPY;  Surgeon: Toledo, Ladell POUR, MD;  Location: [facility];  Service: Gastroenterology;  Laterality: N/A;    FAMILY HISTORY: History reviewed. No pertinent family history.  SOCIAL HISTORY: Social History   Socioeconomic History   Marital status: Single    Spouse name: Not  on file   Number of children: 0   Years of education: Not on file   Highest education  level: Not on file  Occupational History   Not on file  Tobacco Use   Smoking status: Never   Smokeless tobacco: Never  Vaping Use   Vaping status: Never Used  Substance and Sexual Activity   Alcohol use: No   Drug use: No   Sexual activity: Not Currently  Other Topics Concern   Not on file  Social History Narrative   Right handed   No caffeine   Lives at group house, [facility] [place] [id number]   Social Drivers of Health   Financial Resource Strain: Low Risk  (12/25/2022)   Received from [facility]   Overall Financial Resource Strain (CARDIA)    Difficulty of Paying Living Expenses: Not hard at all  Food Insecurity: No Food Insecurity (12/25/2022)   Received from [facility]   Hunger Vital Sign    Within the past 12 months, you worried that your food would run out before you got the money to buy more.: Never true    Within the past 12 months, the food you bought just didn't last and you didn't have money to get more.: Never true  Transportation Needs: No Transportation Needs (12/25/2022)   Received from [facility] - Transportation    In the past 12 months, has lack of transportation kept you from medical appointments or from getting medications?: No    Lack of Transportation (Non-Medical): No  Physical Activity: Not on file  Stress: Not on file  Social Connections: Not on file  Intimate Partner Violence: Not on file   PHYSICAL EXAM  Vitals:   01/16/24 1418  BP: 119/81  Pulse: 77  SpO2: 97%  Weight: 185 lb (83.9 kg)   Body mass index is 28.98 kg/m.  Generalized: Well developed, in no acute distress, well-groomed Neurological examination  Mentation: Alert, history is provided by his caregiver, speech is loud, somewhat abrupt, fast movements, follows most exam commands Cranial nerve II-XII: Pupils were equal round reactive to light. Extraocular movements were full, visual field were  full on confrontational test. Facial sensation and strength were normal. Head turning and shoulder shrug were normal and symmetric. Motor: The motor testing reveals 5 over 5 strength of all 4 extremities. Good symmetric motor tone is noted throughout.  Sensory: Sensory testing is intact to soft touch on all 4 extremities. No evidence of extinction is noted.  Coordination: Cerebellar testing reveals good finger-nose-finger and heel-to-shin bilaterally. No tremor noted.  Gait and station: Gait is normal walks fast   DIAGNOSTIC DATA (LABS, IMAGING, TESTING) - I reviewed patient records, labs, notes, testing and imaging myself where available.  Lab Results  Component Value Date   WBC 7.7 07/05/2023   HGB 14.1 07/05/2023   HCT 39.9 07/05/2023   MCV 97.8 07/05/2023   PLT 271 07/05/2023      Component Value Date/Time   NA 134 (L) 07/05/2023 0819   NA 137 03/13/2013 2100   K 4.4 07/05/2023 0819   K 3.7 03/13/2013 2100   CL 103 07/05/2023 0819   CL 105 03/13/2013 2100   CO2 24 07/05/2023 0819   CO2 31 03/13/2013 2100   GLUCOSE 92 07/05/2023 0819   GLUCOSE 105 (H) 03/13/2013 2100   BUN 9 07/05/2023 0819   BUN 14 03/13/2013 2100   CREATININE 0.91 07/05/2023 0819  CREATININE 1.15 03/13/2013 2100   CALCIUM 9.3 07/05/2023 0819   CALCIUM 9.1 03/13/2013 2100   PROT 8.1 07/05/2023 0819   PROT 7.3 03/13/2013 2100   ALBUMIN 4.6 07/05/2023 0819   ALBUMIN 4.0 03/13/2013 2100   AST 20 07/05/2023 0819   AST 35 03/13/2013 2100   ALT 23 07/05/2023 0819   ALT 27 03/13/2013 2100   ALKPHOS 73 07/05/2023 0819   ALKPHOS 68 03/13/2013 2100   BILITOT 1.0 07/05/2023 0819   BILITOT 0.2 03/13/2013 2100   GFRNONAA >60 07/05/2023 0819   GFRNONAA >60 03/13/2013 2100   GFRAA >60 09/14/2016 1611   GFRAA >60 03/13/2013 2100   No results found for: CHOL, HDL, LDLCALC, LDLDIRECT, TRIG, CHOLHDL No results found for: YHAJ8R No results found for: VITAMINB12 No results found for:  TSH   Lauraine Gayland MANDES, DNP  [facility] [address], [address] [place], [place] [address] [phone]

## 2024-01-16 NOTE — Patient Instructions (Addendum)
 Check labs today  Continue current dosing of Depakote  and Trileptal , will contact if need to make changes based on labs  Thanks   Meds ordered this encounter  Medications   divalproex  (DEPAKOTE  ER) 500 MG 24 hr tablet    Sig: Take 1 tablet (500 mg total) by mouth at bedtime. To take with an additional 250 for a total of 750 mg nightly    Dispense:  90 tablet    Refill:  3   divalproex  (DEPAKOTE  ER) 250 MG 24 hr tablet    Sig: Take 1 tablet (250 mg total) by mouth at bedtime. With the Depakote  ER 500 mg    Dispense:  90 tablet    Refill:  3   Oxcarbazepine  (TRILEPTAL ) 300 MG tablet    Sig: Take 1 tablet (300 mg total) by mouth 2 (two) times daily.    Dispense:  180 tablet    Refill:  3

## 2024-01-18 LAB — VALPROIC ACID LEVEL: Valproic Acid Lvl: 41 ug/mL — ABNORMAL LOW (ref 50–100)

## 2024-01-18 LAB — OXCARBAZEPINE (TRILEPTAL), SERUM: Oxcarbazepine SerPl-Mcnc: 8 ug/mL — ABNORMAL LOW (ref 10–35)

## 2024-01-21 ENCOUNTER — Ambulatory Visit: Payer: Self-pay | Admitting: Neurology

## 2024-02-09 ENCOUNTER — Emergency Department
Admission: EM | Admit: 2024-02-09 | Discharge: 2024-02-10 | Disposition: A | Attending: Emergency Medicine | Admitting: Emergency Medicine

## 2024-02-09 ENCOUNTER — Emergency Department

## 2024-02-09 DIAGNOSIS — R799 Abnormal finding of blood chemistry, unspecified: Secondary | ICD-10-CM | POA: Diagnosis not present

## 2024-02-09 DIAGNOSIS — S0990XA Unspecified injury of head, initial encounter: Secondary | ICD-10-CM | POA: Insufficient documentation

## 2024-02-09 DIAGNOSIS — W228XXA Striking against or struck by other objects, initial encounter: Secondary | ICD-10-CM | POA: Diagnosis not present

## 2024-02-09 DIAGNOSIS — F840 Autistic disorder: Secondary | ICD-10-CM | POA: Diagnosis not present

## 2024-02-09 DIAGNOSIS — G40909 Epilepsy, unspecified, not intractable, without status epilepticus: Secondary | ICD-10-CM | POA: Insufficient documentation

## 2024-02-09 DIAGNOSIS — R569 Unspecified convulsions: Secondary | ICD-10-CM

## 2024-02-09 LAB — BASIC METABOLIC PANEL WITH GFR
Anion gap: 14 (ref 5–15)
BUN: 10 mg/dL (ref 6–20)
CO2: 19 mmol/L — ABNORMAL LOW (ref 22–32)
Calcium: 8.9 mg/dL (ref 8.9–10.3)
Chloride: 106 mmol/L (ref 98–111)
Creatinine, Ser: 0.8 mg/dL (ref 0.61–1.24)
GFR, Estimated: >60 mL/min (ref >60)
Glucose, Bld: 85 mg/dL (ref 70–99)
Potassium: 4.6 mmol/L (ref 3.5–5.1)
Sodium: 139 mmol/L (ref 135–145)

## 2024-02-09 LAB — URINALYSIS, W/ REFLEX TO CULTURE (INFECTION SUSPECTED)
Bacteria, UA: NONE SEEN
Bilirubin Urine: NEGATIVE
Glucose, UA: NEGATIVE mg/dL
Hgb urine dipstick: NEGATIVE
Ketones, ur: NEGATIVE mg/dL
Leukocytes,Ua: NEGATIVE
Nitrite: NEGATIVE
Protein, ur: NEGATIVE mg/dL
Specific Gravity, Urine: 1.017 (ref 1.005–1.030)
Squamous Epithelial / HPF: 0 /HPF (ref 0–5)
WBC, UA: 0 WBC/hpf (ref 0–5)
pH: 7 (ref 5.0–8.0)

## 2024-02-09 LAB — CBC WITH DIFFERENTIAL/PLATELET
Abs Immature Granulocytes: 0.04 K/uL (ref 0.00–0.07)
Basophils Absolute: 0 K/uL (ref 0.0–0.1)
Basophils Relative: 0 %
Eosinophils Absolute: 0.1 K/uL (ref 0.0–0.5)
Eosinophils Relative: 1 %
HCT: 42.6 % (ref 39.0–52.0)
Hemoglobin: 14.1 g/dL (ref 13.0–17.0)
Immature Granulocytes: 1 %
Lymphocytes Relative: 26 %
Lymphs Abs: 1.8 K/uL (ref 0.7–4.0)
MCH: 33.8 pg (ref 26.0–34.0)
MCHC: 33.1 g/dL (ref 30.0–36.0)
MCV: 102.2 fL — ABNORMAL HIGH (ref 80.0–100.0)
Monocytes Absolute: 0.5 K/uL (ref 0.1–1.0)
Monocytes Relative: 8 %
Neutro Abs: 4.4 K/uL (ref 1.7–7.7)
Neutrophils Relative %: 64 %
Platelets: 244 K/uL (ref 150–400)
RBC: 4.17 MIL/uL — ABNORMAL LOW (ref 4.22–5.81)
RDW: 11.9 % (ref 11.5–15.5)
WBC: 6.8 K/uL (ref 4.0–10.5)
nRBC: 0 % (ref 0.0–0.2)

## 2024-02-09 LAB — CBG MONITORING, ED: Glucose-Capillary: 79 mg/dL (ref 70–99)

## 2024-02-09 LAB — VALPROIC ACID LEVEL: Valproic Acid Lvl: 34 ug/mL — ABNORMAL LOW (ref 50–100)

## 2024-02-09 MED ORDER — DIVALPROEX SODIUM ER 250 MG PO TB24
750.0000 mg | ORAL_TABLET | Freq: Once | ORAL | Status: AC
  Administered 2024-02-09: 750 mg via ORAL
  Filled 2024-02-09: qty 3

## 2024-02-09 MED ORDER — OXCARBAZEPINE 300 MG PO TABS
300.0000 mg | ORAL_TABLET | Freq: Once | ORAL | Status: AC
  Administered 2024-02-09: 300 mg via ORAL
  Filled 2024-02-09: qty 1

## 2024-02-09 NOTE — ED Provider Notes (Signed)
 [facility] Provider Note    Event Date/Time   First MD Initiated Contact with Patient 02/09/24 2135     (approximate)   History   Seizures   HPI  ISSAIAH SEABROOKS is a 53 y.o. male history of autism, intellectual delay, anxiety disorder, seizures, presenting with seizure.  It was witnessed is agreeable to having a seizure, did hit his head against the baseboard.  It was his usual tonic-clonic seizure.  Patient stated that he was standing, did hit his head on the baseboard.  No vision changes, no weakness or numbness, he denies any other symptoms, no other infectious symptoms or urinary symptoms.  Per independent history from EMS, patient does have history of seizures, last 1 was 2 months ago.  Patient was initially postictal for them but cleared up.  Has not taken his nighttime medications.  Per group home he is at his mental baseline.    On independent chart review, he does have history of seizures on is on Trileptal , Depakote .  Is on respite all and lorazepam for psych issues.   Physical Exam   Triage Vital Signs: ED Triage Vitals  Encounter Vitals Group     BP      Girls Systolic BP Percentile      Girls Diastolic BP Percentile      Boys Systolic BP Percentile      Boys Diastolic BP Percentile      Pulse      Resp      Temp      Temp src      SpO2      Weight      Height      Head Circumference      Peak Flow      Pain Score      Pain Loc      Pain Education      Exclude from Growth Chart     Most recent vital signs: Vitals:   02/09/24 2147  BP: 113/73  Pulse: 88  Resp: 16  Temp: 97.9 F (36.6 C)  SpO2: 99%     General: Awake, no distress.  CV:  Good peripheral perfusion.  Resp:  Normal effort.  No tachypnea or respiratory distress Abd:  No distention.  Soft nontender Other:  No palpable skull deformities or tenderness, no slurred speech or facial droop, no focal weakness or numbness.   ED Results / Procedures /  Treatments   Labs (all labs ordered are listed, but only abnormal results are displayed) Labs Reviewed  BASIC METABOLIC PANEL WITH GFR  CBC WITH DIFFERENTIAL/PLATELET  URINALYSIS, W/ REFLEX TO CULTURE (INFECTION SUSPECTED)  VALPROIC ACID  LEVEL  CBG MONITORING, ED       RADIOLOGY On my independent interpretation, CT head without obvious intracranial hemorrhage   PROCEDURES:  Critical Care performed: No  Procedures   MEDICATIONS ORDERED IN ED: Medications  divalproex  (DEPAKOTE  ER) 24 hr tablet 750 mg (750 mg Oral Given 02/09/24 2239)  Oxcarbazepine  (TRILEPTAL ) tablet 300 mg (300 mg Oral Given 02/09/24 2238)     IMPRESSION / MDM / ASSESSMENT AND PLAN / ED COURSE  I reviewed the triage vital signs and the nursing notes.                              Differential diagnosis includes, but is not limited to, breakthrough seizure, patient denies any infectious symptoms, also consider electrolyte derangements, did  hit his head on the baseboard.  Get a CT head to make sure no fracture or intracranial hemorrhage, will get labs, UA.  Will send a valproic level.  Patient has not taken his nighttime medications, will give him his nighttime Depakote  and Trileptal .  Patient's presentation is most consistent with acute presentation with potential threat to life or bodily function.  Independent interpretation of labs and imaging below.  Patient monitored here without additional seizure-like activity.  Patient signed out pending labs.  Likely will be discharged back to facility.    Clinical Course as of 02/09/24 2301  Sun Feb 09, 2024  2251 CT Head Wo Contrast 1. No acute intracranial abnormality.  [TT]    Clinical Course User Index [TT] Waymond, Lorelle Cummins, MD     FINAL CLINICAL IMPRESSION(S) / ED DIAGNOSES   Final diagnoses:  Seizure (HCC)  Closed head injury, initial encounter     Rx / DC Orders   ED Discharge Orders     None        Note:  This document was prepared  using Dragon voice recognition software and may include unintentional dictation errors.    Waymond Lorelle Cummins, MD 02/09/24 [phone]

## 2024-02-09 NOTE — ED Triage Notes (Signed)
 Pt here via EMS from care facility due to witnessed seizure from standing. Pt was seen down for 15-30 sec (hx of seizures). Was post-ictal w/ EMS arrival. Some abrasions to right lateral forearm and medial 1st digit of left foot, and dorsum of foot proximal to proximal phalanx of 5th digit of right foot. Pt has mental delays, yells when he speaks. Denies any pain.

## 2024-02-10 NOTE — ED Provider Notes (Signed)
 Emergency department handoff note  Care of this patient was signed out to me at the end of the previous provider shift.  All pertinent patient information was conveyed and all questions were answered.  Patient pending laboratory evaluation which is significant for subtherapeutic valproic acid .  Patient's caregiver at bedside and counseled about returning patient to his normal medication regimen.  Caregiver expresses understanding and given return precautions prior to discharge  Dispo: Discharge home with PCP follow-up as needed   Vilda Zollner K, MD 02/10/24 [phone]

## 2025-01-21 ENCOUNTER — Ambulatory Visit: Admitting: Neurology
